# Patient Record
Sex: Male | Born: 1985 | Race: White | Hispanic: No | Marital: Single | Smoking: Never smoker
Health system: Southern US, Community
[De-identification: ages and names within clinical notes are randomized; demographics above are authoritative.]

## PROBLEM LIST (undated history)

## (undated) DIAGNOSIS — K469 Unspecified abdominal hernia without obstruction or gangrene: Secondary | ICD-10-CM

## (undated) DIAGNOSIS — K56609 Unspecified intestinal obstruction, unspecified as to partial versus complete obstruction: Secondary | ICD-10-CM

## (undated) HISTORY — PX: APPENDECTOMY: SHX54

---

## 2018-03-06 DIAGNOSIS — J029 Acute pharyngitis, unspecified: Secondary | ICD-10-CM | POA: Diagnosis not present

## 2018-03-06 DIAGNOSIS — H60392 Other infective otitis externa, left ear: Secondary | ICD-10-CM | POA: Diagnosis not present

## 2018-03-06 DIAGNOSIS — H66002 Acute suppurative otitis media without spontaneous rupture of ear drum, left ear: Secondary | ICD-10-CM | POA: Diagnosis not present

## 2018-04-26 ENCOUNTER — Ambulatory Visit (HOSPITAL_COMMUNITY)
Admission: EM | Admit: 2018-04-26 | Discharge: 2018-04-26 | Disposition: A | Discharge status: Home / Self Care | Payer: BLUE CROSS/BLUE SHIELD | Attending: Family Medicine | Admitting: Family Medicine

## 2018-04-26 ENCOUNTER — Encounter (HOSPITAL_COMMUNITY): Payer: Self-pay | Admitting: Emergency Medicine

## 2018-04-26 DIAGNOSIS — J029 Acute pharyngitis, unspecified: Secondary | ICD-10-CM | POA: Insufficient documentation

## 2018-04-26 DIAGNOSIS — H60501 Unspecified acute noninfective otitis externa, right ear: Secondary | ICD-10-CM | POA: Insufficient documentation

## 2018-04-26 LAB — POCT RAPID STREP A: STREPTOCOCCUS, GROUP A SCREEN (DIRECT): NEGATIVE

## 2018-04-26 MED ORDER — ACETIC ACID 2 % OT SOLN: 5.0000 [drp] | Freq: Three times a day (TID) | OTIC | 0 refills | Status: AC

## 2018-04-26 NOTE — Discharge Instructions (Addendum)
Strep test negative, will send out for culture and we will call you with results You will be treated today for swimmers ear.  Use medication as directed and to completion.   Get plenty of rest and push fluids Continue OTC medications like ibuprofen and/or tylenol as needed  Follow up with PCP or [facility] if symptoms persists Return or go to ER if patient has any new or worsening symptoms

## 2018-04-26 NOTE — ED Triage Notes (Signed)
Pt here for sore throat and pain in ears

## 2018-04-26 NOTE — ED Provider Notes (Signed)
[facility]ARE CENTER   [id number] 04/26/18 Arrival Time: 1644  [id number] THROAT and ear pain  SUBJECTIVE: History from: patient.  Robert Franco is a 32 y.o. male who presents with abrupt onset of sore throat and right ear pain x 2 days.  Denies positive sick exposure or precipitating event.  Does admit to running with ear buds.  Has tried OTC medications with temporary relief.  Denies aggravating factors. Has been diagnosed with strep throat and swimmer's ear in the past. Reports similar symptoms. Denies fever, chills, fatigue, sinus pain, rhinorrhea, cough, SOB, wheezing, chest pain, nausea, rash, changes in bowel or bladder habits.    ROS: As per HPI.  History reviewed. No pertinent past medical history. History reviewed. No pertinent surgical history. No Known Allergies No current facility-administered medications on file prior to encounter.    No current outpatient medications on file prior to encounter.   Social History   Socioeconomic History  . Marital status: Single    Spouse name: Not on file  . Number of children: Not on file  . Years of education: Not on file  . Highest education level: Not on file  Occupational History  . Not on file  Social Needs  . Financial resource strain: Not on file  . Food insecurity:    Worry: Not on file    Inability: Not on file  . Transportation needs:    Medical: Not on file    Non-medical: Not on file  Tobacco Use  . Smoking status: Current Some Day Smoker  . Smokeless tobacco: Never Used  Substance and Sexual Activity  . Alcohol use: Yes  . Drug use: Not Currently  . Sexual activity: Not on file  Lifestyle  . Physical activity:    Days per week: Not on file    Minutes per session: Not on file  . Stress: Not on file  Relationships  . Social connections:    Talks on phone: Not on file    Gets together: Not on file    Attends religious service: Not on file    Active member of club or organization: Not on file    Attends  meetings of clubs or organizations: Not on file    Relationship status: Not on file  . Intimate partner violence:    Fear of current or ex partner: Not on file    Emotionally abused: Not on file    Physically abused: Not on file    Forced sexual activity: Not on file  Other Topics Concern  . Not on file  Social History Narrative  . Not on file   History reviewed. No pertinent family history.  OBJECTIVE:  Vitals:   04/26/18 1725  BP: 133/74  Pulse: 65  Resp: 18  Temp: 98 F (36.7 C)  TempSrc: Oral  SpO2: 100%     General appearance: alert; active; nontoxic appearance HEENT: Ears: LT EAC clear, RT EAC appears mildly swollen without erythema,TMs mildly injected, no tenderness with tragal or auricle manipulation; Eyes: PERRL.  EOM grossly intact.  Sinuses nontender; Nose: no obvious clear rhinorrhea; tonsils nonerythematous, uvula midline  Neck: submandibular LAD RT sided Lungs: CTA bilaterally without adventitious breath sounds Heart: regular rate and rhythm.  Radial pulses 2+ symmetrical bilaterally Skin: warm and dry Psychological: alert and cooperative; normal mood and affect  Labs: Results for orders placed or performed during the hospital encounter of 04/26/18 (from the past 24 hour(s))  POCT rapid strep A [facility])  Status: None   Collection Time: 04/26/18  5:42 PM  Result Value Ref Range   Streptococcus, Group A Screen (Direct) NEGATIVE NEGATIVE     ASSESSMENT & PLAN:  1. Acute otitis externa of right ear, unspecified type   2. Sore throat     Meds ordered this encounter  Medications  . acetic acid 2 % otic solution    Sig: Place 5 drops into the right ear 3 (three) times daily for 10 days.    Dispense:  15 mL    Refill:  0    Order Specific Question:   Supervising Provider    Answer:   Isa Rankin [[id number]]   Strep test negative, will send out for culture and we will call you with results You will be treated today for swimmers ear.   Use medication as directed and to completion.   Get plenty of rest and push fluids Continue OTC medications like ibuprofen and/or tylenol as needed  Follow up with PCP if symptoms persists Return or go to ER if patient has any new or worsening symptoms   Reviewed expectations re: course of current medical issues. Questions answered. Outlined signs and symptoms indicating need for more acute intervention. Patient verbalized understanding. After Visit Summary given.        Rennis Harding, PA-C 04/26/18 1826

## 2018-04-29 LAB — CULTURE, GROUP A STREP (THRC)

## 2018-09-18 DIAGNOSIS — M791 Myalgia, unspecified site: Secondary | ICD-10-CM | POA: Diagnosis not present

## 2018-09-18 DIAGNOSIS — H66003 Acute suppurative otitis media without spontaneous rupture of ear drum, bilateral: Secondary | ICD-10-CM | POA: Diagnosis not present

## 2018-09-18 DIAGNOSIS — J029 Acute pharyngitis, unspecified: Secondary | ICD-10-CM | POA: Diagnosis not present

## 2018-09-18 DIAGNOSIS — J111 Influenza due to unidentified influenza virus with other respiratory manifestations: Secondary | ICD-10-CM | POA: Diagnosis not present

## 2019-01-24 ENCOUNTER — Encounter (HOSPITAL_COMMUNITY): Payer: Self-pay | Admitting: *Deleted

## 2019-01-24 ENCOUNTER — Ambulatory Visit (INDEPENDENT_AMBULATORY_CARE_PROVIDER_SITE_OTHER)
Admission: EM | Admit: 2019-01-24 | Discharge: 2019-01-24 | Disposition: A | Discharge status: Home / Self Care | Payer: BC Managed Care – PPO | Source: Home / Self Care

## 2019-01-24 ENCOUNTER — Emergency Department (HOSPITAL_COMMUNITY): Payer: BC Managed Care – PPO

## 2019-01-24 ENCOUNTER — Other Ambulatory Visit: Payer: Self-pay

## 2019-01-24 ENCOUNTER — Emergency Department (HOSPITAL_COMMUNITY)
Admission: EM | Admit: 2019-01-24 | Discharge: 2019-01-24 | Disposition: A | Discharge status: Home / Self Care | Payer: BC Managed Care – PPO | Attending: Emergency Medicine | Admitting: Emergency Medicine

## 2019-01-24 ENCOUNTER — Encounter (HOSPITAL_COMMUNITY): Payer: Self-pay

## 2019-01-24 DIAGNOSIS — R42 Dizziness and giddiness: Secondary | ICD-10-CM

## 2019-01-24 DIAGNOSIS — R110 Nausea: Secondary | ICD-10-CM | POA: Diagnosis not present

## 2019-01-24 DIAGNOSIS — F1290 Cannabis use, unspecified, uncomplicated: Secondary | ICD-10-CM | POA: Diagnosis not present

## 2019-01-24 DIAGNOSIS — R51 Headache: Secondary | ICD-10-CM | POA: Insufficient documentation

## 2019-01-24 DIAGNOSIS — R519 Headache, unspecified: Secondary | ICD-10-CM

## 2019-01-24 LAB — BASIC METABOLIC PANEL
Anion gap: 12 (ref 5–15)
BUN: 8 mg/dL (ref 6–20)
CO2: 24 mmol/L (ref 22–32)
Calcium: 9.8 mg/dL (ref 8.9–10.3)
Chloride: 103 mmol/L (ref 98–111)
Creatinine, Ser: 0.9 mg/dL (ref 0.61–1.24)
GFR calc Af Amer: >60 mL/min (ref >60)
GFR calc non Af Amer: >60 mL/min (ref >60)
Glucose, Bld: 77 mg/dL (ref 70–99)
Potassium: 4.3 mmol/L (ref 3.5–5.1)
Sodium: 139 mmol/L (ref 135–145)

## 2019-01-24 LAB — CBC
HCT: 46 % (ref 39.0–52.0)
Hemoglobin: 16.4 g/dL (ref 13.0–17.0)
MCH: 33.3 pg (ref 26.0–34.0)
MCHC: 35.7 g/dL (ref 30.0–36.0)
MCV: 93.5 fL (ref 80.0–100.0)
Platelets: 255 10*3/uL (ref 150–400)
RBC: 4.92 MIL/uL (ref 4.22–5.81)
RDW: 11.8 % (ref 11.5–15.5)
WBC: 5.3 10*3/uL (ref 4.0–10.5)
nRBC: 0 % (ref 0.0–0.2)

## 2019-01-24 MED ORDER — DIPHENHYDRAMINE HCL 25 MG PO CAPS
50.0000 mg | ORAL_CAPSULE | Freq: Once | ORAL | Status: AC
  Administered 2019-01-24: 50 mg via ORAL
  Filled 2019-01-24: qty 2

## 2019-01-24 MED ORDER — SODIUM CHLORIDE 0.9% FLUSH
3.0000 mL | Freq: Once | INTRAVENOUS | Status: AC
  Administered 2019-01-24: 3 mL via INTRAVENOUS

## 2019-01-24 MED ORDER — KETOROLAC TROMETHAMINE 30 MG/ML IJ SOLN
30.0000 mg | Freq: Once | INTRAMUSCULAR | Status: AC
  Administered 2019-01-24: 30 mg via INTRAVENOUS

## 2019-01-24 MED ORDER — SODIUM CHLORIDE 0.9 % IV BOLUS
1000.0000 mL | Freq: Once | INTRAVENOUS | Status: AC
  Administered 2019-01-24: 1000 mL via INTRAVENOUS

## 2019-01-24 MED ORDER — KETOROLAC TROMETHAMINE 30 MG/ML IJ SOLN
30.0000 mg | Freq: Once | INTRAMUSCULAR | Status: DC
  Filled 2019-01-24: qty 1

## 2019-01-24 MED ORDER — PROCHLORPERAZINE MALEATE 5 MG PO TABS
10.0000 mg | ORAL_TABLET | Freq: Once | ORAL | Status: AC
  Administered 2019-01-24: 10 mg via ORAL
  Filled 2019-01-24: qty 2

## 2019-01-24 NOTE — ED Triage Notes (Signed)
To ED for eval of HA and dizziness for the past 4 weeks. Pt states the pain never goes away but will 'level out'. No seizure. No vomiting. Does complain of nausea. States on father's day he had a syncopal episode - ems came but vs where fine so he wasn't transported. Appears in nad. Pupils equal. No OTC meds used for pain. No neuro deficits noted.

## 2019-01-24 NOTE — ED Provider Notes (Signed)
[facility] EMERGENCY DEPARTMENT Provider Note   CSN: [id number] Arrival date & time: 01/24/19  1419    History   Chief Complaint Chief Complaint  Patient presents with  . Headache  . Dizziness    HPI Robert Franco is a 33 y.o. male who presents to the ED today complaining of gradual onset, consistent, diffuse headache and lightheadedness x 1.5 months. Pt reports that the headaches are present when he wakes up and mildly subsides throughout the day although still present; they then wake him up at nighttime. Pt has not been taking anything for it. He reports that about 1 month ago on father's day he had a syncopal episode; EMS came to evaluated patient but he did not come to the ED afterwards. No other episodes of syncope noted. He was seen at Urgent Care this AM for same; sent to the ED for further workup given length of symptoms. Per patient, UC recommended "scans." Denies fever, chills, vision changes, chest pain, shortness of breath, abdominal pain, nausea, vomiting, diarrhea, room spinning dizziness, unilateral weakness or numbness, neck stiffness, rash, or any other associated symptoms.       History reviewed. No pertinent past medical history.  There are no active problems to display for this patient.   Past Surgical History:  Procedure Laterality Date  . APPENDECTOMY          Home Medications    Prior to Admission medications   Not on File    Family History Family History  Problem Relation Age of Onset  . Healthy Mother   . Hypertension Father     Social History Social History   Tobacco Use  . Smoking status: Never Smoker  . Smokeless tobacco: Never Used  Substance Use Topics  . Alcohol use: Yes    Comment: socially  . Drug use: Yes    Types: Marijuana    Comment: sparingly     Allergies   Patient has no known allergies.   Review of Systems Review of Systems  Constitutional: Negative for chills and fever.  HENT: Negative  for congestion.   Eyes: Negative for visual disturbance.  Respiratory: Negative for cough and shortness of breath.   Cardiovascular: Negative for chest pain.  Gastrointestinal: Negative for abdominal pain, nausea and vomiting.  Genitourinary: Negative for difficulty urinating.  Musculoskeletal: Negative for myalgias, neck pain and neck stiffness.  Skin: Negative for rash.  Neurological: Positive for light-headedness and headaches. Negative for dizziness, weakness and numbness.     Physical Exam Updated Vital Signs BP 133/84 (BP Location: Left Arm)   Pulse 64   Temp 99 F (37.2 C) (Oral)   Resp 19   SpO2 100%   Physical Exam Vitals signs and nursing note reviewed.  Constitutional:      Appearance: He is not ill-appearing.  HENT:     Head: Normocephalic and atraumatic.  Eyes:     Extraocular Movements: Extraocular movements intact.     Right eye: No nystagmus.     Left eye: No nystagmus.     Conjunctiva/sclera: Conjunctivae normal.     Pupils: Pupils are equal, round, and reactive to light.  Neck:     Musculoskeletal: Neck supple.  Cardiovascular:     Rate and Rhythm: Normal rate and regular rhythm.     Heart sounds: Normal heart sounds.  Pulmonary:     Effort: Pulmonary effort is normal.     Breath sounds: Normal breath sounds. No wheezing, rhonchi or rales.  Abdominal:  Palpations: Abdomen is soft.     Tenderness: There is no abdominal tenderness.  Skin:    General: Skin is warm and dry.  Neurological:     Mental Status: He is alert.     Comments: CN 3-12 grossly intact A&O x4 GCS 15 Sensation and strength intact Coordination with finger-to-nose WNL Neg romberg, neg pronator drift       ED Treatments / Results  Labs (all labs ordered are listed, but only abnormal results are displayed) Labs Reviewed  BASIC METABOLIC PANEL  CBC    EKG None  Radiology Ct Head Wo Contrast  Result Date: 01/24/2019 CLINICAL DATA:  Headache and dizziness for 4  weeks. EXAM: CT HEAD WITHOUT CONTRAST TECHNIQUE: Contiguous axial images were obtained from the base of the skull through the vertex without intravenous contrast. COMPARISON:  None. FINDINGS: Brain: No evidence of acute infarction, hemorrhage, hydrocephalus, extra-axial collection or mass lesion/mass effect. Vascular: No hyperdense vessel or unexpected calcification. Skull: Normal. Negative for fracture or focal lesion. Sinuses/Orbits: Negative. Other: None. IMPRESSION: Normal head CT. Electronically Signed   By: Drusilla Kannerhomas  Dalessio M.D.   On: 01/24/2019 18:20    Procedures Procedures (including critical care time)  Medications Ordered in ED Medications  sodium chloride flush (NS) 0.9 % injection 3 mL (3 mLs Intravenous Given 01/24/19 1907)  prochlorperazine (COMPAZINE) tablet 10 mg (10 mg Oral Given 01/24/19 1908)  diphenhydrAMINE (BENADRYL) capsule 50 mg (50 mg Oral Given 01/24/19 1909)  sodium chloride 0.9 % bolus 1,000 mL (1,000 mLs Intravenous New Bag/Given 01/24/19 1907)  ketorolac (TORADOL) 30 MG/ML injection 30 mg (30 mg Intravenous Given 01/24/19 1907)     Initial Impression / Assessment and Plan / ED Course  I have reviewed the triage vital signs and the nursing notes.  Pertinent labs & imaging results that were available during my care of the patient were reviewed by me and considered in my medical decision making (see chart for details).    33 year old male presenting to the ED with consistent headache x 1.5 months with lightheadedness, 1 episode of syncope 1 month ago. [facility] ED for further eval/"scans" of head. Pt reports headaches are present the entire day; he has not taken anything for symptoms. No focal neuro deficits on exam. No nystagmus to suggest vertigo. No meningeal signs. Pt afebrile in the ED as well without tachypnea or tachycardia. Baseline bloodwork obtained while in the waiting room; reassuring today without signs of infection, anemia, or electrolyte  derangements. CT Head ordered due to length of symptoms although do not suggest any abnormalities. Will reevaluate once imaging returns.   CT Head negative at this time. Will proceed with headache cocktail and reevaluate.   Pt reports his headache feels improved after IVF, compazine, toradol, and benadryl. Will discharge home at this time. Resources given for close follow up; pt does not have a PCP currently. Strict return precautions discussed with patient. He is in agreement with plan at this time and stable for discharge home.        Final Clinical Impressions(s) / ED Diagnoses   Final diagnoses:  Nonintractable episodic headache, unspecified headache type    ED Discharge Orders    None       Tanda RockersVenter, Nieko Clarin, PA-C 01/24/19 2156    Mancel BaleWentz, Elliott, MD 01/27/19 [phone]

## 2019-01-24 NOTE — Discharge Instructions (Signed)
I recommend further evaluation of this persistent headache and instability, in the ER.

## 2019-01-24 NOTE — ED Triage Notes (Signed)
Patient presents to Urgent Care with complaints of headaches and dizziness since about a month ago. Patient reports he drinks approx 8 glasses of water per day.

## 2019-01-24 NOTE — ED Notes (Signed)
Pt discharged with all belongings. Discharge instructions reviewed with pt, and pt verbalized understanding. Opportunity for questions provided.  

## 2019-01-24 NOTE — Discharge Instructions (Addendum)
You were seen in the ED today for a headache and lightheadedness; your labwork and CT scan of your head was reassuring today. You were treated for a headache while in the ED which improved your symptoms. Please follow up with someone outpatient; you can call [facility] to see if they are accepting patients. There is also a number on your discharge paperwork that you can call to find a PCP in your area.

## 2019-01-24 NOTE — ED Notes (Signed)
Patient is being discharged from the Urgent [facility] and sent to the Emergency Department via wheelchair by staff. Per Augusto Gamble, patient is stable but in need of higher level of care due to Headache and Dizziness . Patient is aware and verbalizes understanding of plan of care.   Vitals:   01/24/19 1328  BP: (!) 138/94  Pulse: 68  Resp: 16  Temp: 98 F (36.7 C)  SpO2: 100%

## 2019-01-24 NOTE — ED Provider Notes (Signed)
[facility]    CSN: [id number] Arrival date & time: 01/24/19  1311      History   Chief Complaint Chief Complaint  Patient presents with  . Appointment    1:30  . Headache  . Dizziness    HPI Macauley Mossberg is a 33 y.o. male.   Leroy Kennedy presents with complaints of headache, nausea and instability, which started approximately 1 month ago. He feels like it is worsening. Headache is worse when he first wakes in the morning, seems to improve throughout his day. Has had instances while walking that he needs to sit or lean against something for support due to sensation of almost falling. Pain currently is 4/10. No specific known trigger. Nausea, no vomiting. No neck pain. No fevers or URI symptoms. Father's day weekend while out to eat he lost consciousness. EMS arrived and vitals were stable, therefore he did not seek transport. States he has had increased anxiety lately which is now worse due to his symptoms and "snowballing."  History of migraines with aura, but states this has felt different than his typical migraines. He feels he may have lost weight over the past month. He drinks alcohol socially and smokes marijuana every other week or so. Hasn't taken any medications for his symptoms. No chest pain or shortness of breath. Without contributing medical history.      ROS per HPI, negative if not otherwise mentioned.      History reviewed. No pertinent past medical history.  There are no active problems to display for this patient.   History reviewed. No pertinent surgical history.     Home Medications    Prior to Admission medications   Not on File    Family History Family History  Problem Relation Age of Onset  . Healthy Mother   . Hypertension Father     Social History Social History   Tobacco Use  . Smoking status: Never Smoker  . Smokeless tobacco: Never Used  Substance Use Topics  . Alcohol use: Yes    Comment: socially  . Drug use:  Yes    Types: Marijuana    Comment: sparingly     Allergies   Patient has no known allergies.   Review of Systems Review of Systems   Physical Exam Triage Vital Signs ED Triage Vitals  Enc Vitals Group     BP 01/24/19 1328 (!) 138/94     Pulse Rate 01/24/19 1328 68     Resp 01/24/19 1328 16     Temp 01/24/19 1328 98 F (36.7 C)     Temp Source 01/24/19 1328 Oral     SpO2 01/24/19 1328 100 %     Weight --      Height --      Head Circumference --      Peak Flow --      Pain Score 01/24/19 1326 5     Pain Loc --      Pain Edu? --      Excl. in [place]? --    Orthostatic VS for the past 24 hrs:  BP- Lying Pulse- Lying BP- Sitting Pulse- Sitting BP- Standing at 0 minutes Pulse- Standing at 0 minutes  01/24/19 1329 (!) 138/94 68 (!) 149/94 66 (!) 155/97 73    Updated Vital Signs BP (!) 138/94 (BP Location: Right Arm)   Pulse 68   Temp 98 F (36.7 C) (Oral)   Resp 16   SpO2 100%    Physical  Exam Constitutional:      Appearance: He is well-developed.  HENT:     Head: Normocephalic and atraumatic.  Eyes:     Extraocular Movements: Extraocular movements intact.     Pupils: Pupils are equal, round, and reactive to light.  Neck:     Musculoskeletal: Normal range of motion. No neck rigidity.  Cardiovascular:     Rate and Rhythm: Normal rate and regular rhythm.  Pulmonary:     Effort: Pulmonary effort is normal.     Breath sounds: Normal breath sounds.  Skin:    General: Skin is warm and dry.  Neurological:     Mental Status: He is alert and oriented to person, place, and time.     GCS: GCS eye subscore is 4. GCS verbal subscore is 5. GCS motor subscore is 6.     Cranial Nerves: No cranial nerve deficit or facial asymmetry.     Sensory: No sensory deficit.     Gait: Gait normal.     Comments: Patient with subjective sensation of instability with romberg; ambulatory without difficulty       UC Treatments / Results  Labs (all labs ordered are listed, but only  abnormal results are displayed) Labs Reviewed - No data to display  EKG   Radiology No results found.  Procedures Procedures (including critical care time)  Medications Ordered in UC Medications - No data to display  Initial Impression / Assessment and Plan / UC Course  I have reviewed the triage vital signs and the nursing notes.  Pertinent labs & imaging results that were available during my care of the patient were reviewed by me and considered in my medical decision making (see chart for details).     Normal neurological exam. No head injury. 1 month of headache and instability, worse when he wakes. Hx of migraine- migraine variant? However, this feels different than previous migraines, and he had an episode in which he lost consciousness. IIP? Recommend more thorough evaluation in the ER at this time. Nursing staff providing assistance to ER now. Patient verbalized understanding and agreeable to plan.    Final Clinical Impressions(s) / UC Diagnoses   Final diagnoses:  Bad headache  Dizziness  Nausea     Discharge Instructions     I recommend further evaluation of this persistent headache and instability, in the ER.    ED Prescriptions    None     Controlled Substance Prescriptions [place] Controlled Substance Registry consulted? Not Applicable   Georgetta HaberBurky, Camila Maita B, NP 01/24/19 1407

## 2019-04-16 ENCOUNTER — Other Ambulatory Visit: Payer: Self-pay

## 2019-04-16 DIAGNOSIS — Z20822 Contact with and (suspected) exposure to covid-19: Secondary | ICD-10-CM

## 2019-04-17 LAB — NOVEL CORONAVIRUS, NAA: SARS-CoV-2, NAA: DETECTED — AB

## 2019-04-28 ENCOUNTER — Other Ambulatory Visit: Payer: Self-pay

## 2019-04-28 DIAGNOSIS — Z20822 Contact with and (suspected) exposure to covid-19: Secondary | ICD-10-CM

## 2019-04-28 DIAGNOSIS — Z20828 Contact with and (suspected) exposure to other viral communicable diseases: Secondary | ICD-10-CM | POA: Diagnosis not present

## 2019-04-29 LAB — NOVEL CORONAVIRUS, NAA: SARS-CoV-2, NAA: NOT DETECTED

## 2019-08-05 ENCOUNTER — Ambulatory Visit: Payer: BC Managed Care – PPO | Attending: Internal Medicine

## 2019-08-05 DIAGNOSIS — Z20822 Contact with and (suspected) exposure to covid-19: Secondary | ICD-10-CM

## 2019-08-06 LAB — NOVEL CORONAVIRUS, NAA: SARS-CoV-2, NAA: NOT DETECTED

## 2019-09-01 ENCOUNTER — Ambulatory Visit: Payer: BC Managed Care – PPO | Attending: Internal Medicine

## 2019-09-01 DIAGNOSIS — Z20822 Contact with and (suspected) exposure to covid-19: Secondary | ICD-10-CM | POA: Diagnosis not present

## 2019-09-02 LAB — NOVEL CORONAVIRUS, NAA: SARS-CoV-2, NAA: NOT DETECTED

## 2019-12-24 DIAGNOSIS — K4090 Unilateral inguinal hernia, without obstruction or gangrene, not specified as recurrent: Secondary | ICD-10-CM | POA: Diagnosis not present

## 2019-12-24 DIAGNOSIS — R1084 Generalized abdominal pain: Secondary | ICD-10-CM | POA: Diagnosis not present

## 2019-12-24 DIAGNOSIS — Z13220 Encounter for screening for lipoid disorders: Secondary | ICD-10-CM | POA: Diagnosis not present

## 2019-12-24 DIAGNOSIS — Z800 Family history of malignant neoplasm of digestive organs: Secondary | ICD-10-CM | POA: Diagnosis not present

## 2019-12-24 DIAGNOSIS — Z23 Encounter for immunization: Secondary | ICD-10-CM | POA: Diagnosis not present

## 2019-12-24 DIAGNOSIS — Z8719 Personal history of other diseases of the digestive system: Secondary | ICD-10-CM | POA: Diagnosis not present

## 2020-01-08 DIAGNOSIS — Z8719 Personal history of other diseases of the digestive system: Secondary | ICD-10-CM | POA: Diagnosis not present

## 2020-01-08 DIAGNOSIS — K4090 Unilateral inguinal hernia, without obstruction or gangrene, not specified as recurrent: Secondary | ICD-10-CM | POA: Diagnosis not present

## 2020-01-12 ENCOUNTER — Other Ambulatory Visit: Payer: Self-pay | Admitting: Surgery

## 2020-01-12 DIAGNOSIS — Z8719 Personal history of other diseases of the digestive system: Secondary | ICD-10-CM

## 2020-01-13 IMAGING — CT CT HEAD WITHOUT CONTRAST
3 of 4 series · 13 of 47 positions shown, 15 images · non-contrast
Comparison: None.

CLINICAL DATA: Headache and dizziness for 4 weeks.

EXAM:
CT HEAD WITHOUT CONTRAST
TECHNIQUE: Contiguous axial images were obtained from the base of the skull
through the vertex without intravenous contrast.

[Series 3: head wo · axial · 0.46mm/px · z∈[-113,+7]mm · 7 of 34 slices shown, 9 images]
[im 5/34  brain]
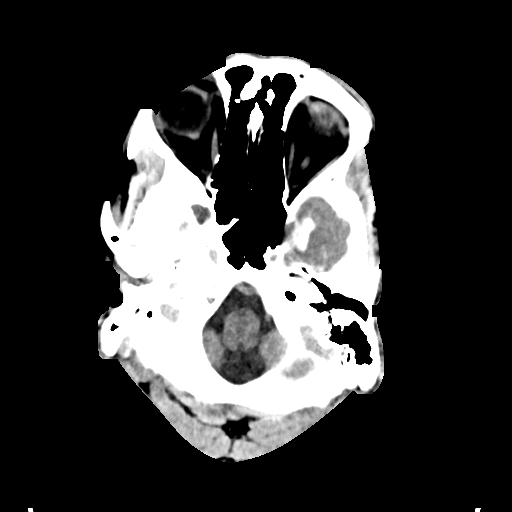
[im 5/34  bone]
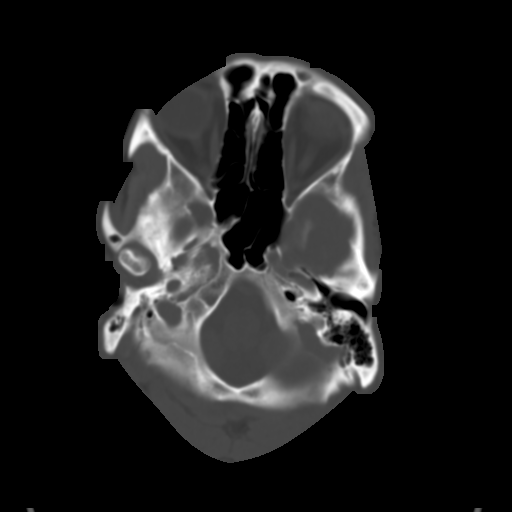
[im 9/34  brain]
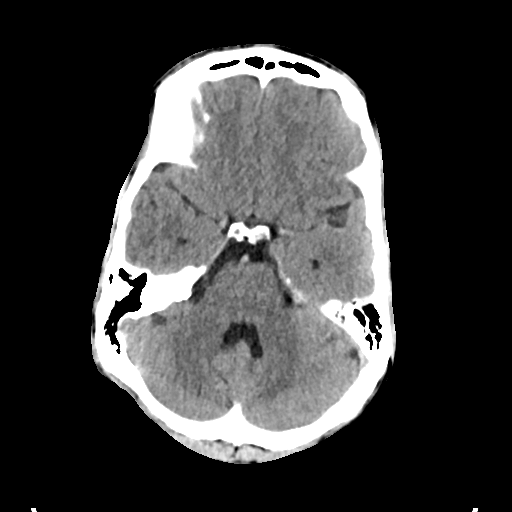
[im 13/34  brain]
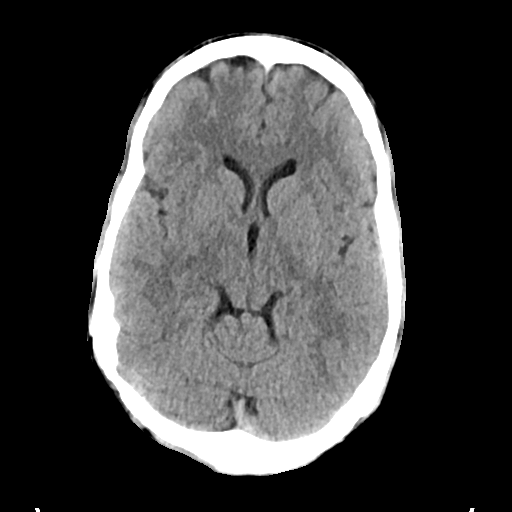
[im 17/34  brain]
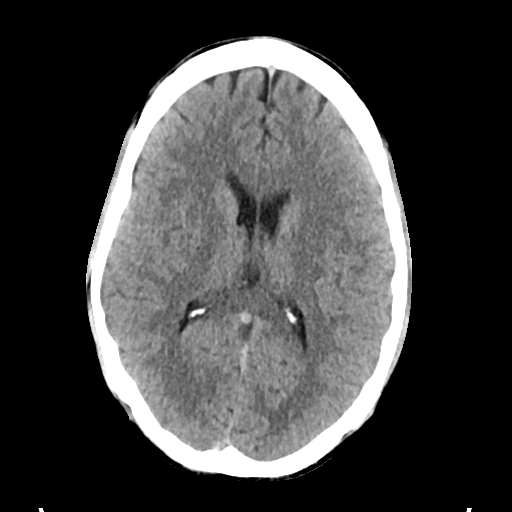
[im 21/34  brain]
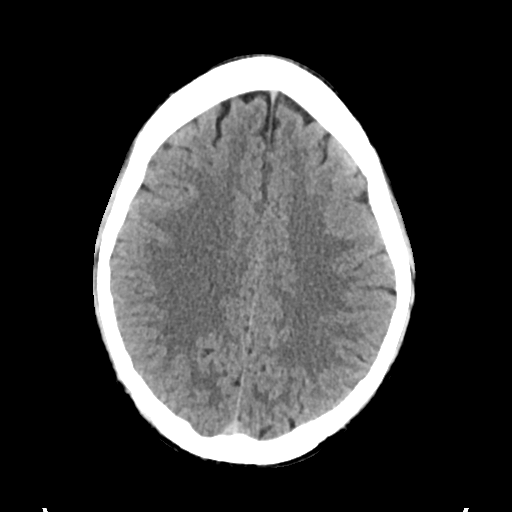
[im 21/34  bone]
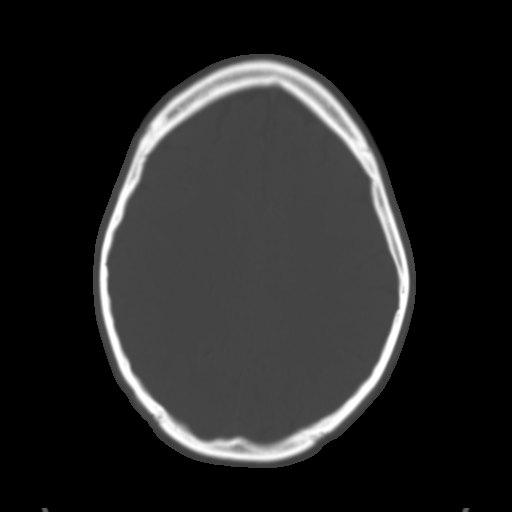
[im 25/34  brain]
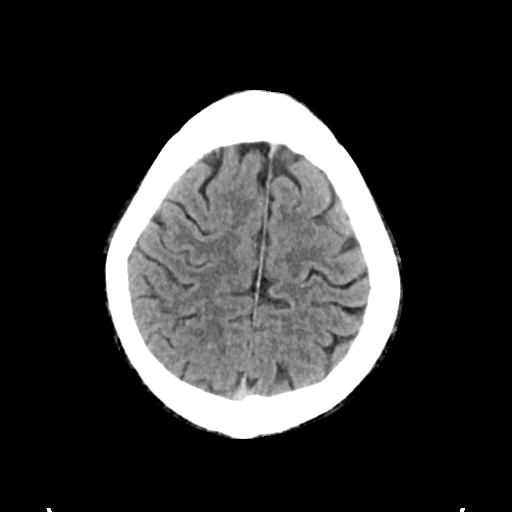
[im 29/34  brain]
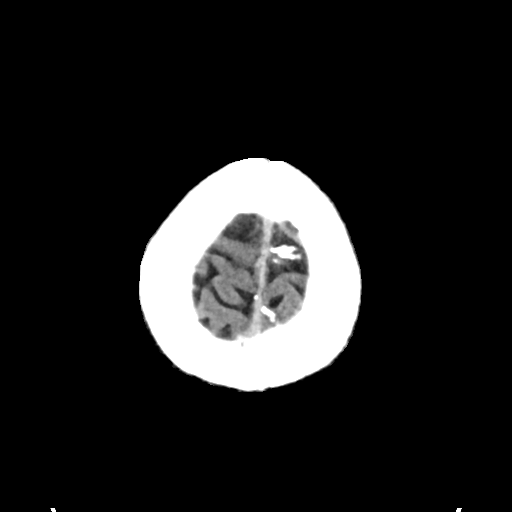

[Series 5: cor soft · coronal · 0.32mm/px · 3 of 77 slices shown]
[im 26/77  brain]
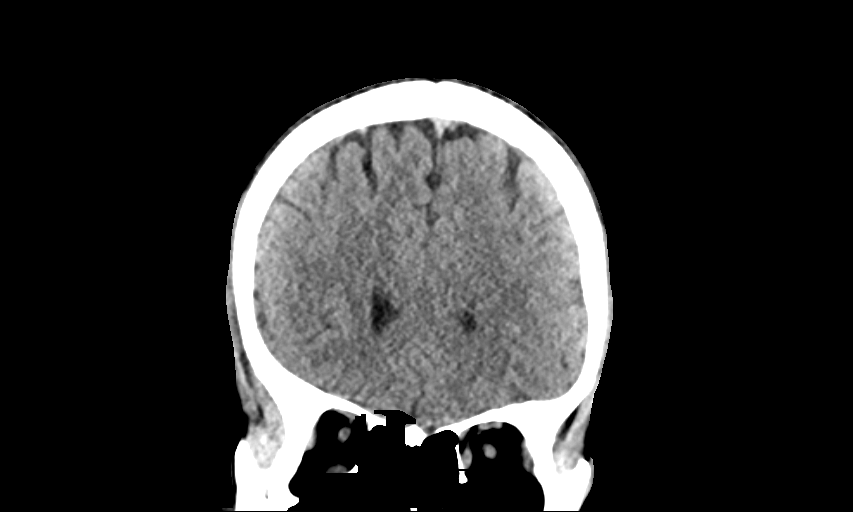
[im 34/77  brain]
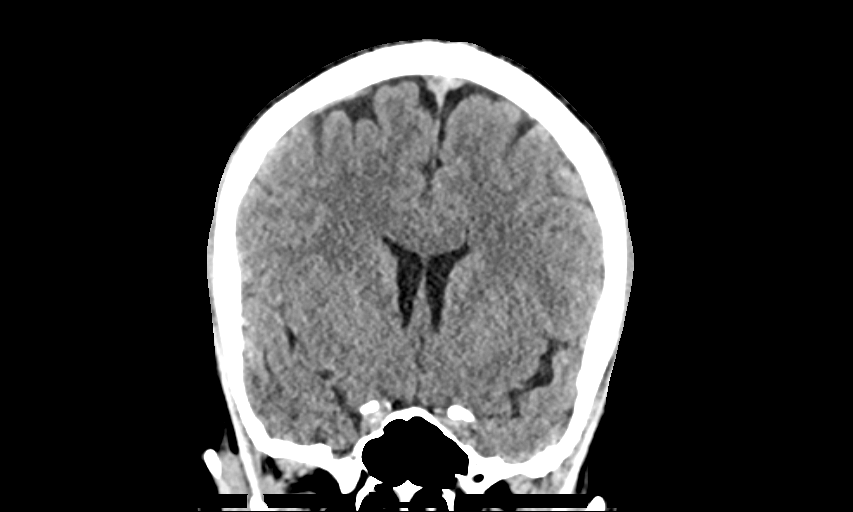
[im 43/77  brain]
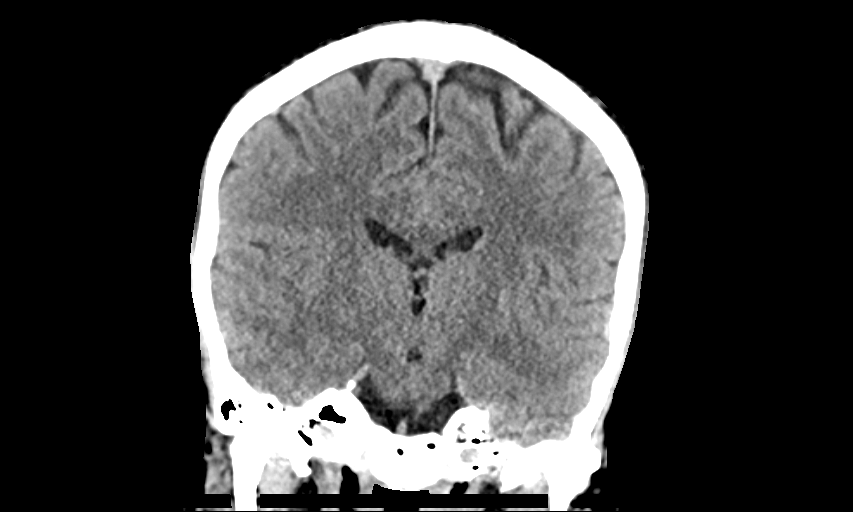

[Series 6: sag soft · sagittal · 0.32mm/px · 3 of 67 slices shown]
[im 23/67  brain]
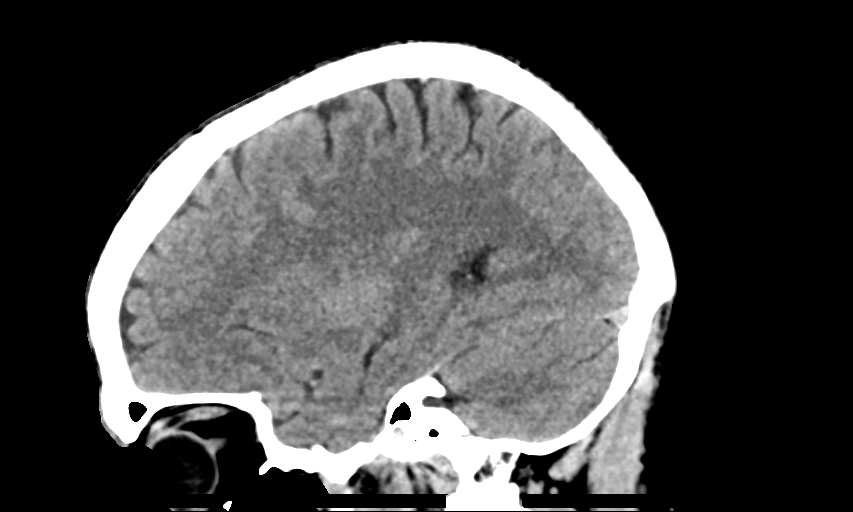
[im 34/67  brain]
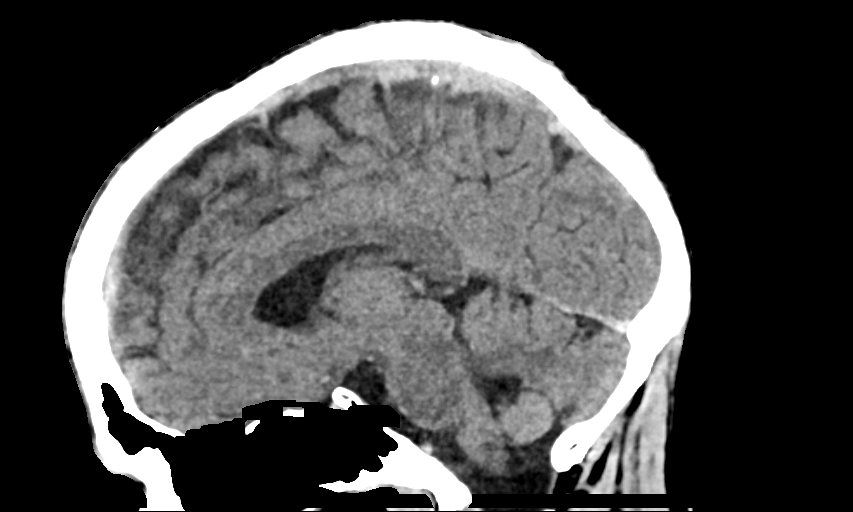
[im 45/67  brain]
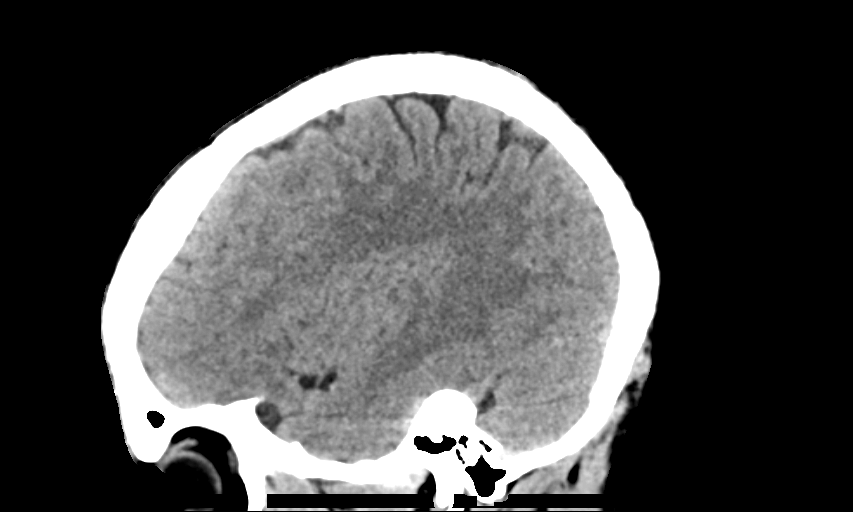

[13 of 47 positions shown; findings below may reference images not displayed]

FINDINGS: Brain: No evidence of acute infarction, hemorrhage, hydrocephalus,
extra-axial collection or mass lesion/mass effect.

Vascular: No hyperdense vessel or unexpected calcification.

Skull: Normal. Negative for fracture or focal lesion.

Sinuses/Orbits: Negative.

Other: None.
IMPRESSION: Normal head CT.

## 2020-02-03 ENCOUNTER — Other Ambulatory Visit: Payer: Self-pay

## 2020-02-03 ENCOUNTER — Ambulatory Visit
Admission: RE | Admit: 2020-02-03 | Discharge: 2020-02-03 | Disposition: A | Discharge status: Home / Self Care | Payer: BC Managed Care – PPO | Source: Ambulatory Visit | Attending: Surgery | Admitting: Surgery

## 2020-02-03 DIAGNOSIS — K56609 Unspecified intestinal obstruction, unspecified as to partial versus complete obstruction: Secondary | ICD-10-CM | POA: Diagnosis not present

## 2020-02-03 DIAGNOSIS — Z8719 Personal history of other diseases of the digestive system: Secondary | ICD-10-CM

## 2020-02-03 MED ORDER — IOPAMIDOL (ISOVUE-300) INJECTION 61%
100.0000 mL | Freq: Once | INTRAVENOUS | Status: AC | PRN
  Administered 2020-02-03: 100 mL via INTRAVENOUS

## 2020-02-06 DIAGNOSIS — K5901 Slow transit constipation: Secondary | ICD-10-CM | POA: Diagnosis not present

## 2020-02-06 DIAGNOSIS — R634 Abnormal weight loss: Secondary | ICD-10-CM | POA: Diagnosis not present

## 2020-03-01 DIAGNOSIS — G4709 Other insomnia: Secondary | ICD-10-CM | POA: Diagnosis not present

## 2020-05-26 ENCOUNTER — Other Ambulatory Visit: Payer: Self-pay

## 2020-05-26 ENCOUNTER — Ambulatory Visit (HOSPITAL_COMMUNITY)
Admission: EM | Admit: 2020-05-26 | Discharge: 2020-05-26 | Disposition: A | Discharge status: Home / Self Care | Payer: BC Managed Care – PPO | Attending: Family Medicine | Admitting: Family Medicine

## 2020-05-26 ENCOUNTER — Encounter (HOSPITAL_COMMUNITY): Payer: Self-pay

## 2020-05-26 ENCOUNTER — Emergency Department (HOSPITAL_COMMUNITY)
Admission: EM | Admit: 2020-05-26 | Discharge: 2020-05-26 | Disposition: A | Discharge status: Home / Self Care | Payer: BC Managed Care – PPO | Attending: Emergency Medicine | Admitting: Emergency Medicine

## 2020-05-26 DIAGNOSIS — Z5321 Procedure and treatment not carried out due to patient leaving prior to being seen by health care provider: Secondary | ICD-10-CM | POA: Diagnosis not present

## 2020-05-26 DIAGNOSIS — R109 Unspecified abdominal pain: Secondary | ICD-10-CM | POA: Insufficient documentation

## 2020-05-26 DIAGNOSIS — R110 Nausea: Secondary | ICD-10-CM | POA: Diagnosis not present

## 2020-05-26 DIAGNOSIS — R197 Diarrhea, unspecified: Secondary | ICD-10-CM | POA: Diagnosis not present

## 2020-05-26 DIAGNOSIS — R1084 Generalized abdominal pain: Secondary | ICD-10-CM

## 2020-05-26 HISTORY — DX: Unspecified intestinal obstruction, unspecified as to partial versus complete obstruction: K56.609

## 2020-05-26 HISTORY — DX: Unspecified abdominal hernia without obstruction or gangrene: K46.9

## 2020-05-26 LAB — URINALYSIS, ROUTINE W REFLEX MICROSCOPIC
Bilirubin Urine: NEGATIVE
Glucose, UA: NEGATIVE mg/dL
Hgb urine dipstick: NEGATIVE
Ketones, ur: NEGATIVE mg/dL
Leukocytes,Ua: NEGATIVE
Nitrite: NEGATIVE
Protein, ur: NEGATIVE mg/dL
Specific Gravity, Urine: 1.003 — ABNORMAL LOW (ref 1.005–1.030)
pH: 6 (ref 5.0–8.0)

## 2020-05-26 LAB — COMPREHENSIVE METABOLIC PANEL
ALT: 49 U/L — ABNORMAL HIGH (ref 0–44)
AST: 37 U/L (ref 15–41)
Albumin: 4.7 g/dL (ref 3.5–5.0)
Alkaline Phosphatase: 31 U/L — ABNORMAL LOW (ref 38–126)
Anion gap: 9 (ref 5–15)
BUN: 8 mg/dL (ref 6–20)
CO2: 26 mmol/L (ref 22–32)
Calcium: 9.6 mg/dL (ref 8.9–10.3)
Chloride: 102 mmol/L (ref 98–111)
Creatinine, Ser: 0.85 mg/dL (ref 0.61–1.24)
GFR, Estimated: >60 mL/min (ref >60)
Glucose, Bld: 83 mg/dL (ref 70–99)
Potassium: 3.8 mmol/L (ref 3.5–5.1)
Sodium: 137 mmol/L (ref 135–145)
Total Bilirubin: 1.1 mg/dL (ref 0.3–1.2)
Total Protein: 7.4 g/dL (ref 6.5–8.1)

## 2020-05-26 LAB — CBC
HCT: 42.6 % (ref 39.0–52.0)
Hemoglobin: 14.5 g/dL (ref 13.0–17.0)
MCH: 32.1 pg (ref 26.0–34.0)
MCHC: 34 g/dL (ref 30.0–36.0)
MCV: 94.2 fL (ref 80.0–100.0)
Platelets: 240 10*3/uL (ref 150–400)
RBC: 4.52 MIL/uL (ref 4.22–5.81)
RDW: 11.9 % (ref 11.5–15.5)
WBC: 5.6 10*3/uL (ref 4.0–10.5)
nRBC: 0 % (ref 0.0–0.2)

## 2020-05-26 LAB — LIPASE, BLOOD: Lipase: 29 U/L (ref 11–51)

## 2020-05-26 NOTE — Discharge Instructions (Signed)

## 2020-05-26 NOTE — ED Notes (Signed)
The pt handed the stickers to the security guard  And told him that he was leaving

## 2020-05-26 NOTE — ED Triage Notes (Signed)
Pt states that he has been having abd pain since Monday, with nausea and diarrhea, hx of bowel obstruction, sent from [facility]

## 2020-05-26 NOTE — ED Triage Notes (Signed)
Pt present abdominal pain with diarrhea. Symptom started last night.  Pt state that he has loss appetite and some nausea.

## 2020-05-27 NOTE — ED Provider Notes (Signed)
[facility]   [id number] 05/26/20 Arrival Time: 1840  ASSESSMENT & PLAN:  1. Generalized abdominal pain     Discussed lab draw tonight with overnight observation. No frank signs of obstruction on exam. After discussed, he does elect ED evaluation given the pain he is feeling along with h/o bowel obstruction. Discharged to ED; stable.    Discharge Instructions     You have been seen today for abdominal pain. Your evaluation was not suggestive of any emergent condition requiring medical intervention at this time. However, some abdominal problems make take more time to appear. Therefore, it is very important for you to pay attention to any new symptoms or worsening of your current condition.  Please return here or to the Emergency Department immediately should you begin to feel worse in any way or have any of the following symptoms: increasing or different abdominal pain, persistent vomiting, inability to drink fluids, fevers, or shaking chills.       Follow-up Information    [facility] EMERGENCY DEPARTMENT.   Specialty: Emergency Medicine Why: If symptoms worsen in any way. Contact information: [address] [id number] mc [place] [address] [phone]               Reviewed expectations re: course of current medical issues. Questions answered. Outlined signs and symptoms indicating need for more acute intervention. Patient verbalized understanding. After Visit Summary given.   SUBJECTIVE: History from: patient. Robert Franco is a 34 y.o. male who presents with complaint of intermittent lower abdominal pain beginning last evening. H/O similar with SBO. "Kind of feels the same". Decreased appetite. Mild nausea; no emesis. Small stool this morning. Does not recall passing gas from rectum today. Ambulatory without difficulty.  Past Surgical History:  Procedure Laterality Date  . APPENDECTOMY        OBJECTIVE:  Vitals:   05/26/20 1934  BP: 118/79  Pulse: 72  Resp: 18  Temp: 97.8 F (36.6 C)  TempSrc: Oral  SpO2: 100%    General appearance: alert, oriented, no acute distress HEENT: Index; AT; oropharynx moist Lungs: unlabored respirations Abdomen: soft; without distention; mild  and poorly localized tenderness, more over lower abd; bowel sounds present; without masses or organomegaly; without guarding or rebound tenderness Back: without reported CVA tenderness; FROM at waist Extremities: without LE edema; symmetrical; without gross deformities Skin: warm and dry Neurologic: normal gait Psychological: alert and cooperative; normal mood and affect    No Known Allergies                                             Past Medical History:  Diagnosis Date  . Bowel obstruction (HCC)   . Hernia of abdominal cavity     Social History   Socioeconomic History  . Marital status: Single    Spouse name: Not on file  . Number of children: Not on file  . Years of education: Not on file  . Highest education level: Not on file  Occupational History  . Not on file  Tobacco Use  . Smoking status: Never Smoker  . Smokeless tobacco: Never Used  Vaping Use  . Vaping Use: Never used  Substance and Sexual Activity  . Alcohol use: Yes    Comment: socially  . Drug use: Yes    Types: Marijuana    Comment: sparingly  . Sexual  activity: Not on file  Other Topics Concern  . Not on file  Social History Narrative  . Not on file   Social Determinants of Health   Financial Resource Strain:   . Difficulty of Paying Living Expenses: Not on file  Food Insecurity:   . Worried About Programme researcher, broadcasting/film/video in the Last Year: Not on file  . Ran Out of Food in the Last Year: Not on file  Transportation Needs:   . Lack of Transportation (Medical): Not on file  . Lack of Transportation (Non-Medical): Not on file  Physical Activity:   . Days of Exercise per Week: Not on file  . Minutes of  Exercise per Session: Not on file  Stress:   . Feeling of Stress : Not on file  Social Connections:   . Frequency of Communication with Friends and Family: Not on file  . Frequency of Social Gatherings with Friends and Family: Not on file  . Attends Religious Services: Not on file  . Active Member of Clubs or Organizations: Not on file  . Attends Banker Meetings: Not on file  . Marital Status: Not on file  Intimate Partner Violence:   . Fear of Current or Ex-Partner: Not on file  . Emotionally Abused: Not on file  . Physically Abused: Not on file  . Sexually Abused: Not on file    Family History  Problem Relation Age of Onset  . Healthy Mother   . Hypertension Father      Mardella Layman, MD 05/27/20 [phone]

## 2020-08-05 DIAGNOSIS — K644 Residual hemorrhoidal skin tags: Secondary | ICD-10-CM | POA: Diagnosis not present

## 2020-08-05 DIAGNOSIS — Z800 Family history of malignant neoplasm of digestive organs: Secondary | ICD-10-CM | POA: Diagnosis not present

## 2020-08-05 DIAGNOSIS — F419 Anxiety disorder, unspecified: Secondary | ICD-10-CM | POA: Diagnosis not present

## 2020-08-09 DIAGNOSIS — Z800 Family history of malignant neoplasm of digestive organs: Secondary | ICD-10-CM | POA: Diagnosis not present

## 2020-09-08 DIAGNOSIS — F419 Anxiety disorder, unspecified: Secondary | ICD-10-CM | POA: Diagnosis not present

## 2020-10-07 DIAGNOSIS — F419 Anxiety disorder, unspecified: Secondary | ICD-10-CM | POA: Diagnosis not present

## 2021-01-11 DIAGNOSIS — Z0000 Encounter for general adult medical examination without abnormal findings: Secondary | ICD-10-CM | POA: Diagnosis not present

## 2021-01-11 DIAGNOSIS — F419 Anxiety disorder, unspecified: Secondary | ICD-10-CM | POA: Diagnosis not present

## 2021-01-11 DIAGNOSIS — K644 Residual hemorrhoidal skin tags: Secondary | ICD-10-CM | POA: Diagnosis not present

## 2021-01-11 DIAGNOSIS — Z8669 Personal history of other diseases of the nervous system and sense organs: Secondary | ICD-10-CM | POA: Diagnosis not present

## 2021-01-11 DIAGNOSIS — K5900 Constipation, unspecified: Secondary | ICD-10-CM | POA: Diagnosis not present

## 2021-01-22 IMAGING — CT CT ENTEROGRAPHY (ABD-PELV W/ CM)
2 of 5 series · 16 of 46 positions shown, 18 images · IV contrast (iopamidol)
Comparison: None.

CLINICAL DATA: 34-year-old male with history of bowel obstruction
secondary to adhesions.

EXAM:
CT ABDOMEN AND PELVIS WITH CONTRAST (ENTEROGRAPHY)
TECHNIQUE: Multidetector CT of the abdomen and pelvis during bolus
administration of intravenous contrast. Negative oral contrast was
given.
CONTRAST:  100mL A2AT6Z-H00 IOPAMIDOL (A2AT6Z-H00) INJECTION 61%

[Series 4: enterography 2.00 br40 s3 cor · coronal · 0.77mm/px · 3 of 143 slices shown]
[im 48/143  soft-tissue]
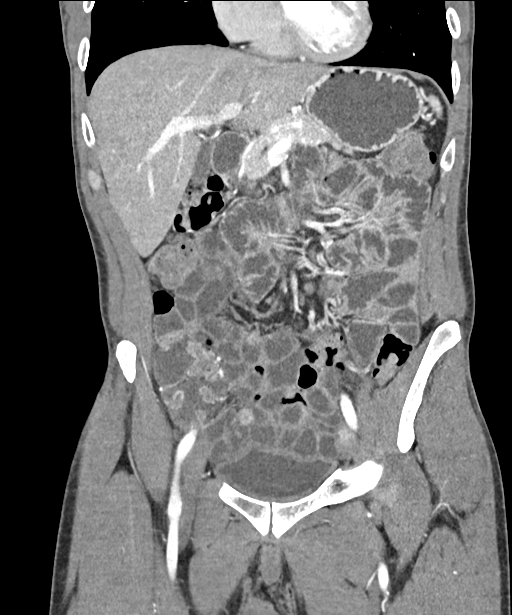
[im 64/143  soft-tissue]
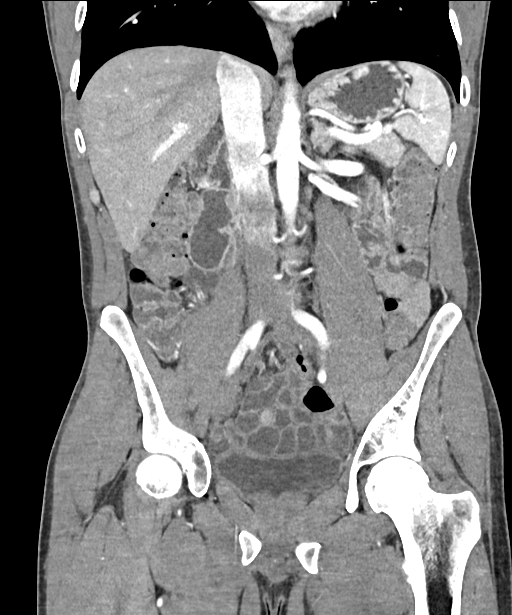
[im 79/143  soft-tissue]
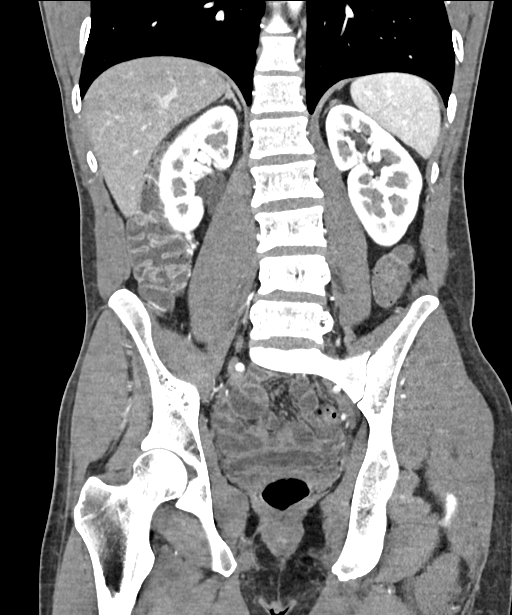

[Series 9: enterography 3.00 br40 s3 axial 3mm · axial · 0.77mm/px · z∈[+1106,+1517]mm · 13 of 157 slices shown, 15 images]
[im 10/157  soft-tissue]
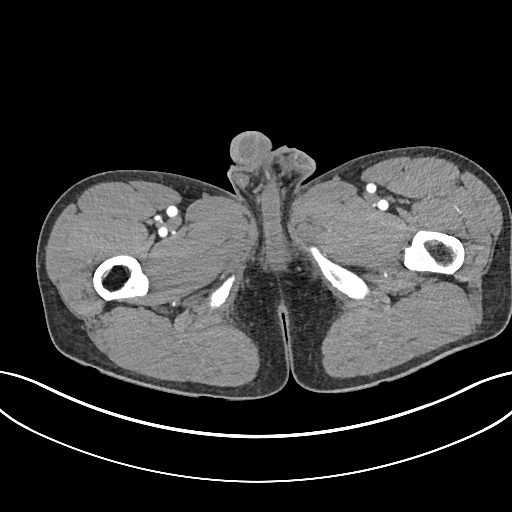
[im 10/157  bone]
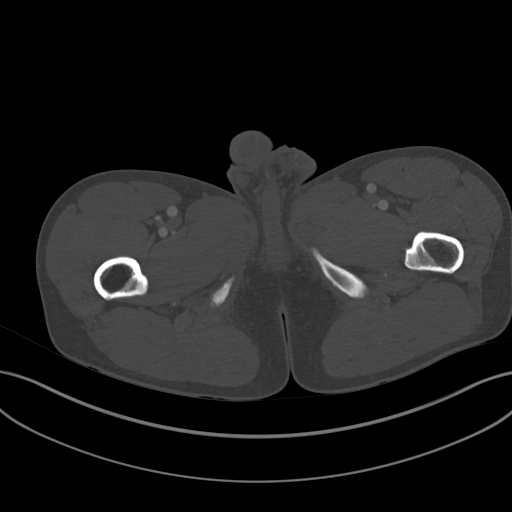
[im 19/157  soft-tissue]
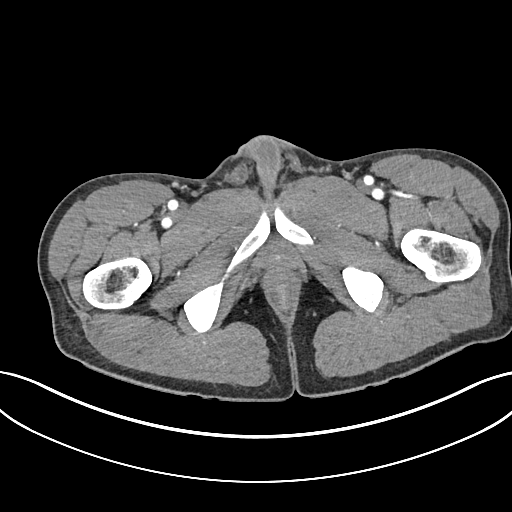
[im 37/157  soft-tissue]
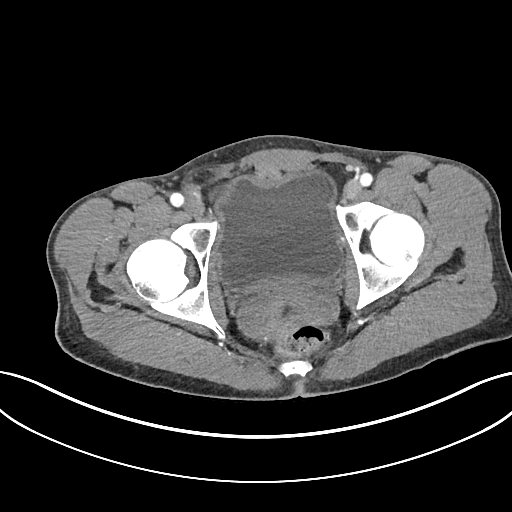
[im 46/157  soft-tissue]
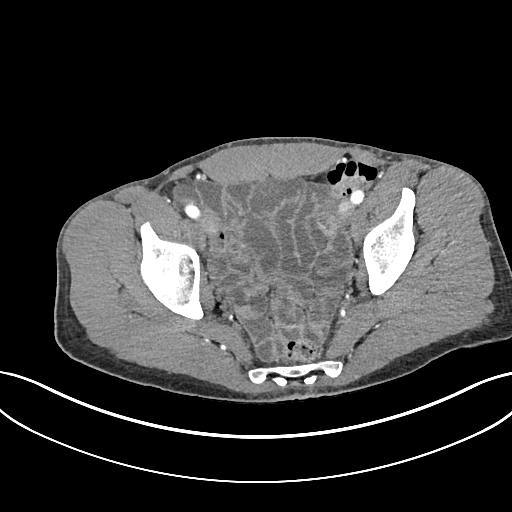
[im 56/157  soft-tissue]
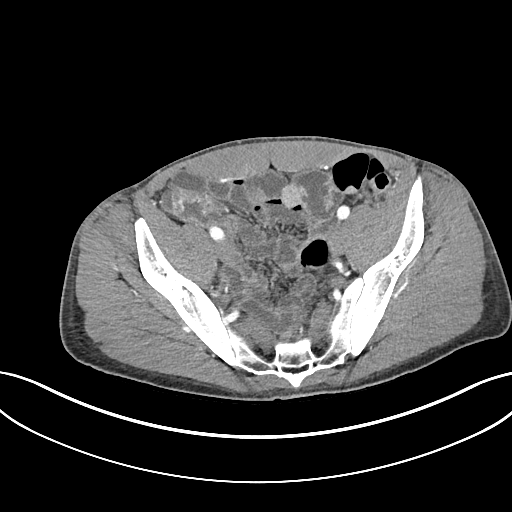
[im 65/157  soft-tissue]
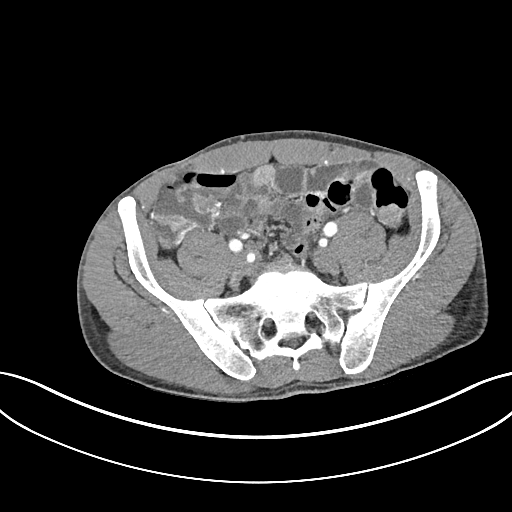
[im 83/157  soft-tissue]
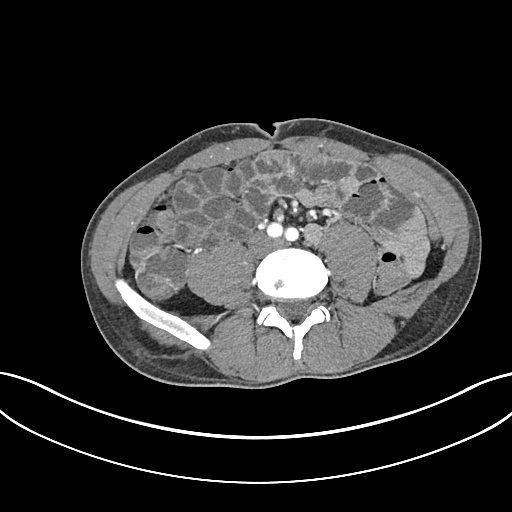
[im 92/157  soft-tissue]
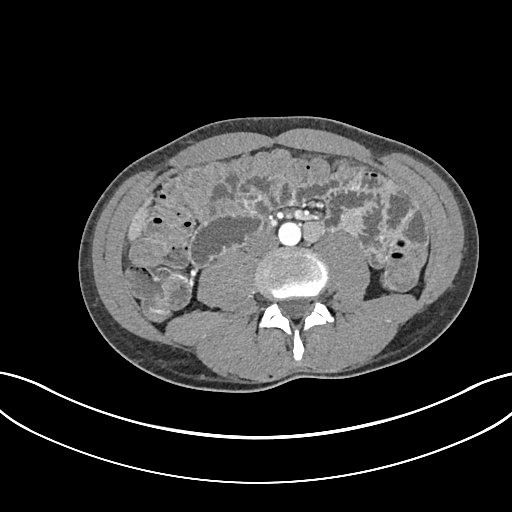
[im 101/157  soft-tissue]
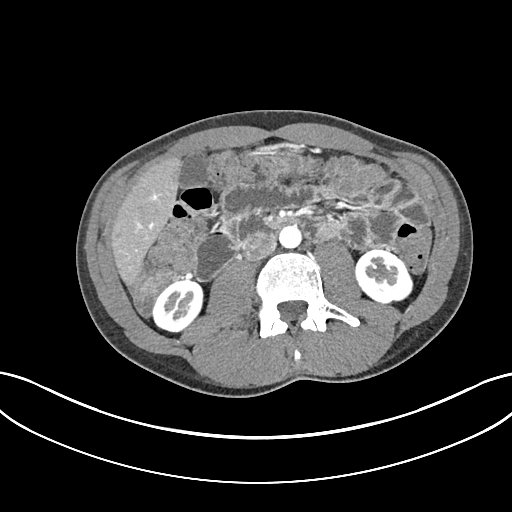
[im 101/157  bone]
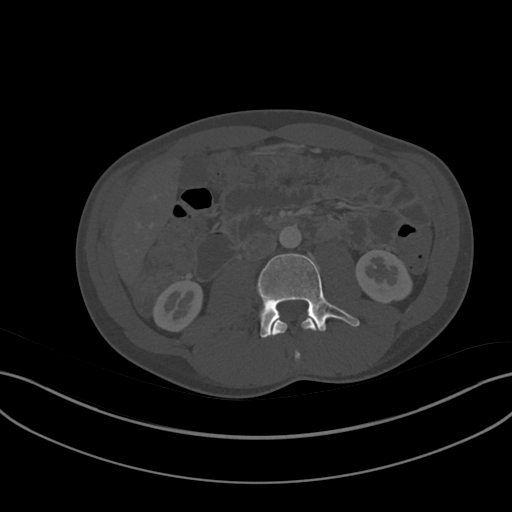
[im 111/157  soft-tissue]
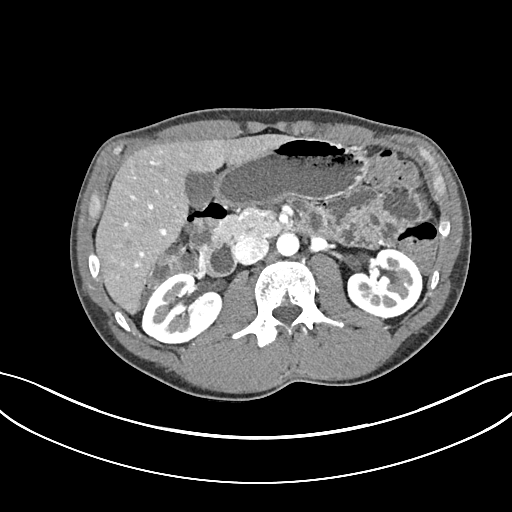
[im 120/157  soft-tissue]
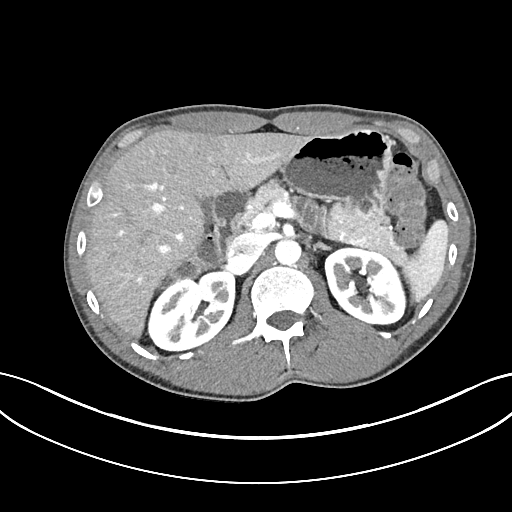
[im 138/157  soft-tissue]
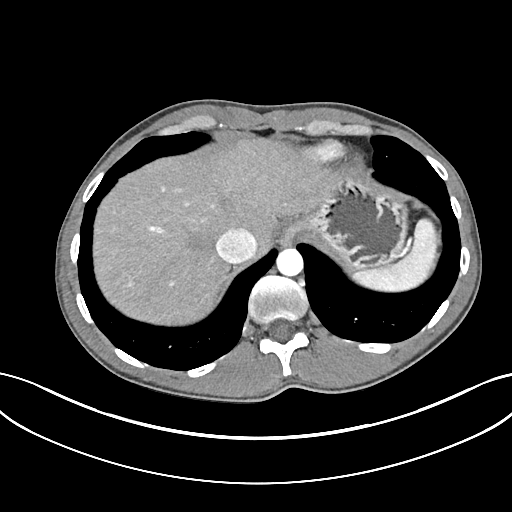
[im 147/157  soft-tissue]
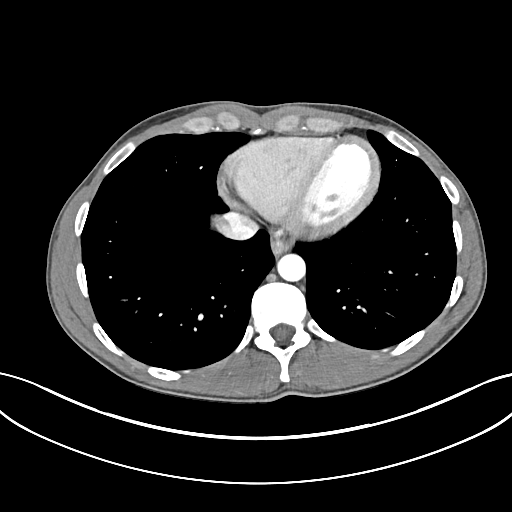

[16 of 46 positions shown; findings below may reference images not displayed]

FINDINGS: Lower chest:  Unremarkable.

Hepatobiliary: No suspicious cystic or solid hepatic lesions. No
intra or extrahepatic biliary ductal dilatation. Gallbladder is
normal in appearance.

Pancreas: No pancreatic mass. No pancreatic ductal dilatation. No
pancreatic or peripancreatic fluid collections or inflammatory
changes.

Spleen: Unremarkable.

Adrenals/Urinary Tract: Bilateral kidneys and adrenal glands are
normal in appearance. No hydroureteronephrosis. Urinary bladder is
normal in appearance.

Stomach/Bowel: Normal appearance of the stomach. No pathologic
dilatation of small bowel or colon. No focal areas of mural
thickening, mucosal hyperenhancement or surrounding inflammatory
changes are noted in the stomach, small bowel or colon. Normal
appendix.

Vascular/Lymphatic: No significant atherosclerotic disease, aneurysm
or dissection noted in the abdominal or pelvic vasculature. No
lymphadenopathy noted in the abdomen or pelvis.

Reproductive: Prostate gland and seminal vesicles are unremarkable
in appearance.

Other: No significant volume of ascites.  No pneumoperitoneum.

Musculoskeletal: There are no aggressive appearing lytic or blastic
lesions noted in the visualized portions of the skeleton.
IMPRESSION: 1. No acute findings are noted in the abdomen or pelvis.
Specifically, no evidence to suggest persistent bowel obstruction.

## 2021-03-28 DIAGNOSIS — M79672 Pain in left foot: Secondary | ICD-10-CM | POA: Diagnosis not present

## 2021-06-11 DIAGNOSIS — R051 Acute cough: Secondary | ICD-10-CM | POA: Diagnosis not present

## 2021-06-11 DIAGNOSIS — J209 Acute bronchitis, unspecified: Secondary | ICD-10-CM | POA: Diagnosis not present

## 2021-06-11 DIAGNOSIS — R0989 Other specified symptoms and signs involving the circulatory and respiratory systems: Secondary | ICD-10-CM | POA: Diagnosis not present

## 2022-01-13 DIAGNOSIS — Z0000 Encounter for general adult medical examination without abnormal findings: Secondary | ICD-10-CM | POA: Diagnosis not present

## 2022-02-03 DIAGNOSIS — Z800 Family history of malignant neoplasm of digestive organs: Secondary | ICD-10-CM | POA: Diagnosis not present

## 2022-04-07 DIAGNOSIS — R0789 Other chest pain: Secondary | ICD-10-CM | POA: Diagnosis not present

## 2022-04-07 DIAGNOSIS — R059 Cough, unspecified: Secondary | ICD-10-CM | POA: Diagnosis not present

## 2022-07-05 DIAGNOSIS — J069 Acute upper respiratory infection, unspecified: Secondary | ICD-10-CM | POA: Diagnosis not present

## 2022-07-05 DIAGNOSIS — J029 Acute pharyngitis, unspecified: Secondary | ICD-10-CM | POA: Diagnosis not present

## 2022-07-05 DIAGNOSIS — R0982 Postnasal drip: Secondary | ICD-10-CM | POA: Diagnosis not present

## 2022-11-15 DIAGNOSIS — Z0000 Encounter for general adult medical examination without abnormal findings: Secondary | ICD-10-CM | POA: Diagnosis not present

## 2023-01-23 ENCOUNTER — Other Ambulatory Visit: Payer: Self-pay | Admitting: Physician Assistant

## 2023-01-23 ENCOUNTER — Emergency Department (HOSPITAL_COMMUNITY)
Admission: EM | Admit: 2023-01-23 | Discharge: 2023-01-23 | Disposition: A | Discharge status: Home / Self Care | Payer: BC Managed Care – PPO | Attending: Emergency Medicine | Admitting: Emergency Medicine

## 2023-01-23 ENCOUNTER — Encounter (HOSPITAL_COMMUNITY): Payer: Self-pay

## 2023-01-23 ENCOUNTER — Emergency Department (HOSPITAL_COMMUNITY): Payer: BC Managed Care – PPO

## 2023-01-23 ENCOUNTER — Other Ambulatory Visit: Payer: Self-pay

## 2023-01-23 DIAGNOSIS — R109 Unspecified abdominal pain: Secondary | ICD-10-CM | POA: Diagnosis not present

## 2023-01-23 DIAGNOSIS — Z9049 Acquired absence of other specified parts of digestive tract: Secondary | ICD-10-CM | POA: Diagnosis not present

## 2023-01-23 DIAGNOSIS — R1084 Generalized abdominal pain: Secondary | ICD-10-CM | POA: Diagnosis not present

## 2023-01-23 LAB — COMPREHENSIVE METABOLIC PANEL
ALT: 22 U/L (ref 0–44)
AST: 24 U/L (ref 15–41)
Albumin: 4.7 g/dL (ref 3.5–5.0)
Alkaline Phosphatase: 32 U/L — ABNORMAL LOW (ref 38–126)
Anion gap: 10 (ref 5–15)
BUN: 9 mg/dL (ref 6–20)
CO2: 24 mmol/L (ref 22–32)
Calcium: 9.2 mg/dL (ref 8.9–10.3)
Chloride: 105 mmol/L (ref 98–111)
Creatinine, Ser: 0.78 mg/dL (ref 0.61–1.24)
GFR, Estimated: >60 mL/min (ref >60)
Glucose, Bld: 84 mg/dL (ref 70–99)
Potassium: 3.8 mmol/L (ref 3.5–5.1)
Sodium: 139 mmol/L (ref 135–145)
Total Bilirubin: 0.9 mg/dL (ref 0.3–1.2)
Total Protein: 7.6 g/dL (ref 6.5–8.1)

## 2023-01-23 LAB — URINALYSIS, ROUTINE W REFLEX MICROSCOPIC
Bilirubin Urine: NEGATIVE
Glucose, UA: NEGATIVE mg/dL
Hgb urine dipstick: NEGATIVE
Ketones, ur: 20 mg/dL — AB
Leukocytes,Ua: NEGATIVE
Nitrite: NEGATIVE
Protein, ur: NEGATIVE mg/dL
Specific Gravity, Urine: 1.009 (ref 1.005–1.030)
pH: 7 (ref 5.0–8.0)

## 2023-01-23 LAB — CBC WITH DIFFERENTIAL/PLATELET
Abs Immature Granulocytes: 0.01 10*3/uL (ref 0.00–0.07)
Basophils Absolute: 0.1 10*3/uL (ref 0.0–0.1)
Basophils Relative: 1 %
Eosinophils Absolute: 0.1 10*3/uL (ref 0.0–0.5)
Eosinophils Relative: 2 %
HCT: 43.8 % (ref 39.0–52.0)
Hemoglobin: 15.2 g/dL (ref 13.0–17.0)
Immature Granulocytes: 0 %
Lymphocytes Relative: 45 %
Lymphs Abs: 2.2 10*3/uL (ref 0.7–4.0)
MCH: 32.3 pg (ref 26.0–34.0)
MCHC: 34.7 g/dL (ref 30.0–36.0)
MCV: 93 fL (ref 80.0–100.0)
Monocytes Absolute: 0.3 10*3/uL (ref 0.1–1.0)
Monocytes Relative: 7 %
Neutro Abs: 2.1 10*3/uL (ref 1.7–7.7)
Neutrophils Relative %: 45 %
Platelets: 230 10*3/uL (ref 150–400)
RBC: 4.71 MIL/uL (ref 4.22–5.81)
RDW: 11.9 % (ref 11.5–15.5)
WBC: 4.8 10*3/uL (ref 4.0–10.5)
nRBC: 0 % (ref 0.0–0.2)

## 2023-01-23 LAB — LACTIC ACID, PLASMA: Lactic Acid, Venous: 1 mmol/L (ref 0.5–1.9)

## 2023-01-23 LAB — LIPASE, BLOOD: Lipase: 31 U/L (ref 11–51)

## 2023-01-23 MED ORDER — SODIUM CHLORIDE 0.9 % IV BOLUS
1000.0000 mL | Freq: Once | INTRAVENOUS | Status: AC
  Administered 2023-01-23: 1000 mL via INTRAVENOUS

## 2023-01-23 MED ORDER — OXYCODONE-ACETAMINOPHEN 5-325 MG PO TABS: 1.0000 | ORAL_TABLET | Freq: Once | ORAL | Status: DC

## 2023-01-23 MED ORDER — MORPHINE SULFATE (PF) 4 MG/ML IV SOLN
4.0000 mg | Freq: Once | INTRAVENOUS | Status: AC
  Administered 2023-01-23: 4 mg via INTRAVENOUS
  Filled 2023-01-23: qty 1

## 2023-01-23 MED ORDER — IOHEXOL 300 MG/ML  SOLN
100.0000 mL | Freq: Once | INTRAMUSCULAR | Status: AC | PRN
  Administered 2023-01-23: 100 mL via INTRAVENOUS

## 2023-01-23 MED ORDER — ONDANSETRON HCL 4 MG/2ML IJ SOLN
4.0000 mg | Freq: Once | INTRAMUSCULAR | Status: AC
  Administered 2023-01-23: 4 mg via INTRAVENOUS
  Filled 2023-01-23: qty 2

## 2023-01-23 MED ORDER — ONDANSETRON 4 MG PO TBDP
4.0000 mg | ORAL_TABLET | Freq: Once | ORAL | Status: AC
  Administered 2023-01-23: 4 mg via ORAL
  Filled 2023-01-23: qty 1

## 2023-01-23 MED ORDER — OXYCODONE-ACETAMINOPHEN 5-325 MG PO TABS
1.0000 | ORAL_TABLET | Freq: Once | ORAL | Status: AC
  Administered 2023-01-23: 1 via ORAL
  Filled 2023-01-23: qty 1

## 2023-01-23 MED ORDER — OXYCODONE-ACETAMINOPHEN 5-325 MG PO TABS: 1.0000 | ORAL_TABLET | Freq: Four times a day (QID) | ORAL | 0 refills | Status: AC | PRN

## 2023-01-23 MED ORDER — ONDANSETRON HCL 4 MG PO TABS: 4.0000 mg | ORAL_TABLET | Freq: Four times a day (QID) | ORAL | 0 refills | Status: AC

## 2023-01-23 NOTE — ED Provider Triage Note (Signed)
Emergency Medicine Provider Triage Evaluation Note  Robert Franco , a 37 y.o. male  was evaluated in triage.  Pt complains of abdominal pain for a week. Constant pain with no vomiting. Went to PCP today and sent for further evaluation. Had appendectomy age 79 and then had SBO 2018.   Review of Systems  Positive: Abdominal pain  Negative: vomtiing  Physical Exam  BP (!) 141/85 (BP Location: Left Arm)   Pulse 97   Temp 97.7 F (36.5 C) (Oral)   Resp 15   Ht 5\' 10"  (1.778 m)   Wt 72.6 kg   SpO2 100%   BMI 22.96 kg/m  Gen:   Awake, no distress   Resp:  Normal effort  MSK:   Moves extremities without difficulty  Other:  Mild diffuse abdominal tenderness  Medical Decision Making  Medically screening exam initiated at 5:10 PM.  Appropriate orders placed.  Robert Franco was informed that the remainder of the evaluation will be completed by another provider, this initial triage assessment does not replace that evaluation, and the importance of remaining in the ED until their evaluation is complete.  Robert Franco is a 37 y.o. male hx of appendectomy, SBo here with ab pain. Diffuse ab pain for a week. Will get labs and CT ab/pel    Charlynne Pander, MD 01/23/23 [phone]

## 2023-01-23 NOTE — Discharge Instructions (Signed)
Your laboratories also are within normal limits today.  You were given a copy of your CT scan.  You will go home on a short course of nausea medication along with pain medication.  You will need to schedule an appointment with Dr. Ewing Schlein to help further evaluate your abdominal pain.

## 2023-01-23 NOTE — ED Triage Notes (Signed)
Patient presented to ER for abdominal pain. Patient states pain has been going on for about a week, last BM this morning. 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 Patient said he saw PCP who did blood test for "liver, pancreas, and gall bladder issues" but has not heard anything back about the results. Pt A&Ox4, ambulatory to triage bay.

## 2023-01-23 NOTE — ED Provider Notes (Signed)
[facility] EMERGENCY DEPARTMENT AT [facility] Provider Note   CSN: [id number] Arrival date & time: 01/23/23  1436     History SBO Chief Complaint  Patient presents with   Abdominal Pain    Robert Franco is a 37 y.o. male.  37 y.o male with a PMH of SBO, appendectomy presents to the ED with a chief complaint of generalized abdominal pain that is been ongoing since July 3, this abdominal pain occurs at night and usually wakes him up from his sleep.  He reports the pain is been waxing and waning, generalized in nature without any focal point of tenderness.  He was evaluated by PCP today, sent in to rule out small bowel obstruction versus gallbladder etiology.  He endorses some nausea, along with vomiting.  He has tried taking some over-the-counter medication without any improvement in symptoms.  He does not feel that this is a burning sensation such as reflux.  Denies any fever, denies feeling the same symptoms as he did when he had a bowel obstruction.  Last bowel movement was yesterday without any blood in his stool.  The history is provided by the patient and medical records.  Abdominal Pain Associated symptoms: nausea and vomiting   Associated symptoms: no chest pain, no chills, no fever and no shortness of breath        Home Medications Prior to Admission medications   Medication Sig Start Date End Date Taking? Authorizing Provider  ondansetron (ZOFRAN) 4 MG tablet Take 1 tablet (4 mg total) by mouth every 6 (six) hours for 5 days. 01/23/23 01/28/23 Yes Velencia Lenart, Leonie Douglas, PA-C  oxyCODONE-acetaminophen (PERCOCET/ROXICET) 5-325 MG tablet Take 1 tablet by mouth every 6 (six) hours as needed for up to 3 days for severe pain. 01/23/23 01/26/23 Yes Claude Manges, PA-C      Allergies    Patient has no known allergies.    Review of Systems   Review of Systems  Constitutional:  Negative for chills and fever.  Respiratory:  Negative for shortness of breath.   Cardiovascular:  Negative  for chest pain.  Gastrointestinal:  Positive for abdominal pain, nausea and vomiting.  Genitourinary:  Negative for flank pain.  Musculoskeletal:  Negative for back pain.  Skin:  Negative for pallor and wound.  All other systems reviewed and are negative.   Physical Exam Updated Vital Signs BP 128/82 (BP Location: Right Arm)   Pulse 61   Temp 98.5 F (36.9 C) (Oral)   Resp 18   Ht 5\' 10"  (1.778 m)   Wt 72.6 kg   SpO2 100%   BMI 22.96 kg/m  Physical Exam Vitals and nursing note reviewed.  HENT:     Head: Normocephalic and atraumatic.  Cardiovascular:     Rate and Rhythm: Normal rate.  Pulmonary:     Effort: Pulmonary effort is normal.     Breath sounds: No wheezing.  Abdominal:     General: Abdomen is flat. Bowel sounds are decreased.     Palpations: Abdomen is soft.     Tenderness: There is generalized abdominal tenderness. There is guarding. There is no right CVA tenderness or left CVA tenderness.  Skin:    General: Skin is warm and dry.  Neurological:     Mental Status: He is alert and oriented to person, place, and time.     ED Results / Procedures / Treatments   Labs (all labs ordered are listed, but only abnormal results are displayed) Labs Reviewed  COMPREHENSIVE METABOLIC  PANEL - Abnormal; Notable for the following components:      Result Value   Alkaline Phosphatase 32 (*)    All other components within normal limits  URINALYSIS, ROUTINE W REFLEX MICROSCOPIC - Abnormal; Notable for the following components:   Ketones, ur 20 (*)    All other components within normal limits  CBC WITH DIFFERENTIAL/PLATELET  LIPASE, BLOOD  LACTIC ACID, PLASMA  LACTIC ACID, PLASMA    EKG None  Radiology CT ABDOMEN PELVIS W CONTRAST  Result Date: 01/23/2023 CLINICAL DATA:  Acute abdominal pain EXAM: CT ABDOMEN AND PELVIS WITH CONTRAST TECHNIQUE: Multidetector CT imaging of the abdomen and pelvis was performed using the standard protocol following bolus administration of  intravenous contrast. RADIATION DOSE REDUCTION: This exam was performed according to the departmental dose-optimization program which includes automated exposure control, adjustment of the mA and/or kV according to patient size and/or use of iterative reconstruction technique. CONTRAST:  OMNIPAQUE IOHEXOL 300 MG/ML  SOLN COMPARISON:  CT examination dated February 03, 2020 FINDINGS: Lower chest: No acute abnormality. Hepatobiliary: No focal liver abnormality is seen. No gallstones, gallbladder wall thickening, or biliary dilatation. Pancreas: Unremarkable. No pancreatic ductal dilatation or surrounding inflammatory changes. Spleen: Normal in size without focal abnormality. Adrenals/Urinary Tract: Adrenal glands are unremarkable. Kidneys are normal, without renal calculi, focal lesion, or hydronephrosis. Bladder is unremarkable. Stomach/Bowel: Stomach is within normal limits. Appendix not identified, however no inflammatory changes in the pericecal region. No evidence of bowel wall thickening, distention, or inflammatory changes. Vascular/Lymphatic: No significant vascular findings are present. No enlarged abdominal or pelvic lymph nodes. Reproductive: Prostate is unremarkable. Other: No abdominal wall hernia or abnormality. No abdominopelvic ascites. Musculoskeletal: No acute or significant osseous findings. IMPRESSION: 1. No CT evidence of acute abdominal/pelvic process. 2. Appendix not identified, however no inflammatory changes in the pericecal region. 3. No evidence of nephrolithiasis or hydronephrosis. Electronically Signed   By: Larose Hires D.O.   On: 01/23/2023 20:56    Procedures Procedures    Medications Ordered in ED Medications  oxyCODONE-acetaminophen (PERCOCET/ROXICET) 5-325 MG per tablet 1 tablet (has no administration in time range)  oxyCODONE-acetaminophen (PERCOCET/ROXICET) 5-325 MG per tablet 1 tablet (has no administration in time range)  ondansetron (ZOFRAN-ODT) disintegrating tablet  4 mg (has no administration in time range)  morphine (PF) 4 MG/ML injection 4 mg (4 mg Intravenous Given 01/23/23 1958)  sodium chloride 0.9 % bolus 1,000 mL (1,000 mLs Intravenous New Bag/Given 01/23/23 1958)  ondansetron (ZOFRAN) injection 4 mg (4 mg Intravenous Given 01/23/23 1958)  iohexol (OMNIPAQUE) 300 MG/ML solution 100 mL (100 mLs Intravenous Contrast Given 01/23/23 2034)    ED Course/ Medical Decision Making/ A&P                             Medical Decision Making Amount and/or Complexity of Data Reviewed Labs: ordered. Radiology: ordered.  Risk Prescription drug management.     This patient presents to the ED for concern of abdominal pain, this involves a number of treatment options, and is a complaint that carries with it a high risk of complications and morbidity.  The differential diagnosis includes SBO, viral illness versus cholecystitis.    Co morbidities: Discussed in HPI   Brief History:  See HPI.   EMR reviewed including pt PMHx, past surgical history and past visits to ER.   See HPI for more details   Lab Tests:  I ordered and independently interpreted labs.  The pertinent results include:    I personally reviewed all laboratory work and imaging. Metabolic panel without any acute abnormality specifically kidney function within normal limits and no significant electrolyte abnormalities. CBC without leukocytosis or significant anemia.  Imaging Studies:  CT Abdomen and pelvis showed:   Cardiac Monitoring:  N/A  Medicines ordered:  I ordered medication including zofran, morphine, bolus  for symptomatic control Reevaluation of the patient after these medicines showed that the patient stayed the same I have reviewed the patients home medicines and have made adjustments as needed  Reevaluation:  After the interventions noted above I re-evaluated patient and found that they have :improved   Social Determinants of Health:  The patient's social  determinants of health were a factor in the care of this patient  Problem List / ED Course:  Patient here with several days of intermittent abdominal pain generalized without any focal point of tenderness.  Prior history of appendectomy developed adhesions and later developed an SBO.  Does not feel that symptoms are consistent with her prior bowel obstruction.  Did have bowel movement yesterday without any blood in his stool.  He is overall in stable condition, nontoxic in appearance.  Afebrile, no hypoxia, no tachycardia or hypotension.  Given morphine, Zofran, bolus to help with symptomatic treatment. Labs on today's visit without any leukocytosis, hemoglobin is within normal limits.  CMP without any electrolyte abnormality, creatinine levels unremarkable.  Lactic acid is also negative and lipase levels normal.  He does have a prior family history of colon cancer. Patient was informed of CT findings today, he does report improvement in his symptoms after morphine, will be given 1 dose of Percocet prior to discharge.  I did discuss with him that this medication can likely cause constipation and further worsen his pain therefore we will need to add MiraLAX versus stool softeners to help with avoiding constipation.  He is hemodynamically stable, return precautions discussed at length.  Patient stable for discharge.  Dispostion:  After consideration of the diagnostic results and the patients response to treatment, I feel that the patent would benefit from follow up with GI.    Portions of this note were generated with Scientist, clinical (histocompatibility and immunogenetics). Dictation errors may occur despite best attempts at proofreading.   Final Clinical Impression(s) / ED Diagnoses Final diagnoses:  Generalized abdominal pain    Rx / DC Orders ED Discharge Orders          Ordered    ondansetron (ZOFRAN) 4 MG tablet  Every 6 hours        01/23/23 2108    oxyCODONE-acetaminophen (PERCOCET/ROXICET) 5-325 MG tablet  Every 6  hours PRN        01/23/23 2108              Claude Manges, PA-C 01/23/23 2130    Charlynne Pander, MD 01/23/23 [phone]

## 2023-01-23 NOTE — ED Notes (Signed)
Hold lab draw at this time per Triage RN. 

## 2023-01-25 ENCOUNTER — Telehealth: Payer: Self-pay

## 2023-01-25 ENCOUNTER — Encounter: Payer: Self-pay | Admitting: Gastroenterology

## 2023-01-25 NOTE — Telephone Encounter (Signed)
Patient called to schedule a appointment with our office. Informed him he had a appointmnet schedule with [facility] and we would need a referral from his PCP or another office referring him to our office. He states he was seen in the ED on 01/23/2023 and they said he needed to be seen by a ED. Informed him they referred him to [facility] and not [place]. He asked when our first available would be if he got a referral informed him the first of September. He states he will call other GI office then

## 2023-01-29 DIAGNOSIS — K2900 Acute gastritis without bleeding: Secondary | ICD-10-CM | POA: Diagnosis not present

## 2023-01-29 DIAGNOSIS — F419 Anxiety disorder, unspecified: Secondary | ICD-10-CM | POA: Diagnosis not present

## 2023-02-01 ENCOUNTER — Other Ambulatory Visit: Payer: BC Managed Care – PPO

## 2023-03-14 DIAGNOSIS — K4090 Unilateral inguinal hernia, without obstruction or gangrene, not specified as recurrent: Secondary | ICD-10-CM | POA: Diagnosis not present

## 2023-03-14 DIAGNOSIS — R1084 Generalized abdominal pain: Secondary | ICD-10-CM | POA: Diagnosis not present

## 2023-03-14 DIAGNOSIS — R197 Diarrhea, unspecified: Secondary | ICD-10-CM | POA: Diagnosis not present

## 2023-03-14 DIAGNOSIS — K625 Hemorrhage of anus and rectum: Secondary | ICD-10-CM | POA: Diagnosis not present

## 2023-03-19 ENCOUNTER — Telehealth: Payer: Self-pay | Admitting: Gastroenterology

## 2023-03-19 NOTE — Telephone Encounter (Signed)
Wanted to know if he could drink milk during prep.  Advised clear liquids only.  Asked if he could take carafate. Advised to hold on taking carafate.  Asked if his driver has to stay for duration of procedure tomorrow. Discussed to ask the front office staff when he arrives the protocol of [facility]

## 2023-03-20 DIAGNOSIS — R1013 Epigastric pain: Secondary | ICD-10-CM | POA: Diagnosis not present

## 2023-03-20 DIAGNOSIS — K625 Hemorrhage of anus and rectum: Secondary | ICD-10-CM | POA: Diagnosis not present

## 2023-03-20 DIAGNOSIS — R197 Diarrhea, unspecified: Secondary | ICD-10-CM | POA: Diagnosis not present

## 2023-03-20 DIAGNOSIS — K3189 Other diseases of stomach and duodenum: Secondary | ICD-10-CM | POA: Diagnosis not present

## 2023-03-20 DIAGNOSIS — K2970 Gastritis, unspecified, without bleeding: Secondary | ICD-10-CM | POA: Diagnosis not present

## 2023-03-20 DIAGNOSIS — K2100 Gastro-esophageal reflux disease with esophagitis, without bleeding: Secondary | ICD-10-CM | POA: Diagnosis not present

## 2023-03-20 DIAGNOSIS — K921 Melena: Secondary | ICD-10-CM | POA: Diagnosis not present

## 2023-03-20 DIAGNOSIS — K2950 Unspecified chronic gastritis without bleeding: Secondary | ICD-10-CM | POA: Diagnosis not present

## 2023-03-20 DIAGNOSIS — K562 Volvulus: Secondary | ICD-10-CM | POA: Diagnosis not present

## 2023-04-10 ENCOUNTER — Ambulatory Visit: Payer: BC Managed Care – PPO | Admitting: Gastroenterology

## 2023-04-18 DIAGNOSIS — K2100 Gastro-esophageal reflux disease with esophagitis, without bleeding: Secondary | ICD-10-CM | POA: Diagnosis not present

## 2023-06-26 DIAGNOSIS — K2270 Barrett's esophagus without dysplasia: Secondary | ICD-10-CM | POA: Diagnosis not present

## 2023-06-26 DIAGNOSIS — R12 Heartburn: Secondary | ICD-10-CM | POA: Diagnosis not present

## 2023-06-26 DIAGNOSIS — K2100 Gastro-esophageal reflux disease with esophagitis, without bleeding: Secondary | ICD-10-CM | POA: Diagnosis not present

## 2023-06-26 DIAGNOSIS — K209 Esophagitis, unspecified without bleeding: Secondary | ICD-10-CM | POA: Diagnosis not present

## 2023-07-31 ENCOUNTER — Emergency Department: Payer: BC Managed Care – PPO

## 2023-07-31 ENCOUNTER — Other Ambulatory Visit: Payer: Self-pay

## 2023-07-31 ENCOUNTER — Emergency Department
Admission: EM | Admit: 2023-07-31 | Discharge: 2023-07-31 | Disposition: A | Discharge status: Home / Self Care | Payer: BC Managed Care – PPO | Attending: Student in an Organized Health Care Education/Training Program | Admitting: Student in an Organized Health Care Education/Training Program

## 2023-07-31 ENCOUNTER — Emergency Department (HOSPITAL_COMMUNITY)
Admission: EM | Admit: 2023-07-31 | Discharge: 2023-07-31 | Discharge status: Against Medical Advice | Payer: BC Managed Care – PPO

## 2023-07-31 DIAGNOSIS — Z20822 Contact with and (suspected) exposure to covid-19: Secondary | ICD-10-CM | POA: Diagnosis not present

## 2023-07-31 DIAGNOSIS — R110 Nausea: Secondary | ICD-10-CM | POA: Diagnosis not present

## 2023-07-31 DIAGNOSIS — R519 Headache, unspecified: Secondary | ICD-10-CM | POA: Diagnosis not present

## 2023-07-31 DIAGNOSIS — R0981 Nasal congestion: Secondary | ICD-10-CM | POA: Insufficient documentation

## 2023-07-31 DIAGNOSIS — M542 Cervicalgia: Secondary | ICD-10-CM | POA: Diagnosis not present

## 2023-07-31 DIAGNOSIS — G4452 New daily persistent headache (NDPH): Secondary | ICD-10-CM | POA: Diagnosis not present

## 2023-07-31 LAB — CBC WITH DIFFERENTIAL/PLATELET
Abs Immature Granulocytes: 0.01 10*3/uL (ref 0.00–0.07)
Basophils Absolute: 0 10*3/uL (ref 0.0–0.1)
Basophils Relative: 1 %
Eosinophils Absolute: 0.1 10*3/uL (ref 0.0–0.5)
Eosinophils Relative: 1 %
HCT: 43.8 % (ref 39.0–52.0)
Hemoglobin: 15.1 g/dL (ref 13.0–17.0)
Immature Granulocytes: 0 %
Lymphocytes Relative: 19 %
Lymphs Abs: 0.9 10*3/uL (ref 0.7–4.0)
MCH: 32.1 pg (ref 26.0–34.0)
MCHC: 34.5 g/dL (ref 30.0–36.0)
MCV: 93.2 fL (ref 80.0–100.0)
Monocytes Absolute: 0.6 10*3/uL (ref 0.1–1.0)
Monocytes Relative: 12 %
Neutro Abs: 3.2 10*3/uL (ref 1.7–7.7)
Neutrophils Relative %: 67 %
Platelets: 178 10*3/uL (ref 150–400)
RBC: 4.7 MIL/uL (ref 4.22–5.81)
RDW: 12.9 % (ref 11.5–15.5)
WBC: 4.7 10*3/uL (ref 4.0–10.5)
nRBC: 0 % (ref 0.0–0.2)

## 2023-07-31 LAB — RESP PANEL BY RT-PCR (RSV, FLU A&B, COVID)  RVPGX2
Influenza A by PCR: NEGATIVE
Influenza B by PCR: NEGATIVE
Resp Syncytial Virus by PCR: NEGATIVE
SARS Coronavirus 2 by RT PCR: NEGATIVE

## 2023-07-31 LAB — BASIC METABOLIC PANEL
Anion gap: 12 (ref 5–15)
BUN: 13 mg/dL (ref 6–20)
CO2: 26 mmol/L (ref 22–32)
Calcium: 9.2 mg/dL (ref 8.9–10.3)
Chloride: 99 mmol/L (ref 98–111)
Creatinine, Ser: 0.98 mg/dL (ref 0.61–1.24)
GFR, Estimated: >60 mL/min (ref >60)
Glucose, Bld: 95 mg/dL (ref 70–99)
Potassium: 4.2 mmol/L (ref 3.5–5.1)
Sodium: 137 mmol/L (ref 135–145)

## 2023-07-31 LAB — MONONUCLEOSIS SCREEN: Mono Screen: NEGATIVE

## 2023-07-31 LAB — GROUP A STREP BY PCR: Group A Strep by PCR: NOT DETECTED

## 2023-07-31 NOTE — ED Notes (Signed)
 Pt is now at different hospital.

## 2023-07-31 NOTE — ED Provider Notes (Signed)
 [facility] Provider Note    Event Date/Time   First MD Initiated Contact with Patient 07/31/23 1702     (approximate)   History   Neck Pain   HPI  Robert Franco is a 38 y.o. male with no significant past medical history presents the ER for evaluation of 2 to 3 days of headache and some neckache.  States last night he was having some chills but no measured temperature.  Denies any cough.  Has had some mild congestion and ear fullness.  Denies any abdominal pain no nausea or vomiting no blurry vision no numbness or tingling.  Went to urgent care today had viral panel checked and was negative and was told to come to the ER to rule out meningitis.     Physical Exam   Triage Vital Signs: ED Triage Vitals  Encounter Vitals Group     BP 07/31/23 1317 133/86     Systolic BP Percentile --      Diastolic BP Percentile --      Pulse Rate 07/31/23 1317 77     Resp 07/31/23 1317 17     Temp 07/31/23 1319 98.1 F (36.7 C)     Temp Source 07/31/23 1317 Oral     SpO2 07/31/23 1317 100 %     Weight 07/31/23 1319 160 lb 0.9 oz (72.6 kg)     Height 07/31/23 1319 5' 10 (1.778 m)     Head Circumference --      Peak Flow --      Pain Score 07/31/23 1319 7     Pain Loc --      Pain Education --      Exclude from Growth Chart --     Most recent vital signs: Vitals:   07/31/23 1317 07/31/23 1319  BP: 133/86   Pulse: 77   Resp: 17   Temp:  98.1 F (36.7 C)  SpO2: 100%      Constitutional: Alert  Eyes: Conjunctivae are normal.  Head: Atraumatic. Nose: No congestion/rhinnorhea. Mouth/Throat: Mucous membranes are moist.   Neck: Painless ROM.  Cardiovascular:   Good peripheral circulation. Respiratory: Normal respiratory effort.  No retractions.  Gastrointestinal: Soft and nontender.  Musculoskeletal:  no deformity Neurologic:  MAE spontaneously. No gross focal neurologic deficits are appreciated.  Skin:  Skin is warm, dry and intact. No rash  noted. Psychiatric: Mood and affect are normal. Speech and behavior are normal.    ED Results / Procedures / Treatments   Labs (all labs ordered are listed, but only abnormal results are displayed) Labs Reviewed  RESP PANEL BY RT-PCR (RSV, FLU A&B, COVID)  [id number]  GROUP A STREP BY PCR  BASIC METABOLIC PANEL  CBC WITH DIFFERENTIAL/PLATELET  MONONUCLEOSIS SCREEN     EKG     RADIOLOGY Please see ED Course for my review and interpretation.  I personally reviewed all radiographic images ordered to evaluate for the above acute complaints and reviewed radiology reports and findings.  These findings were personally discussed with the patient.  Please see medical record for radiology report.    PROCEDURES:  Critical Care performed:   Procedures   MEDICATIONS ORDERED IN ED: Medications - No data to display   IMPRESSION / MDM / ASSESSMENT AND PLAN / ED COURSE  I reviewed the triage vital signs and the nursing notes.  Differential diagnosis includes, but is not limited to, migraine, tension headache, SAH, IPH, mass, URI  Patient presenting to the ER for evaluation of symptoms as described above.  Based on symptoms, risk factors and considered above differential, this presenting complaint could reflect a potentially life-threatening illness therefore the patient will be placed on continuous pulse oximetry and telemetry for monitoring.  Laboratory evaluation will be sent to evaluate for the above complaints.  Patient is exceedingly well-appearing nontoxic he is afebrile hemodynamically stable.  His blood work is quite reassuring.  He does not have any white count.  CT imaging ordered triage is reassuring.  This is not consistent with SAH or aneurysm is no thunderclap headache.  He has painless full range of motion.  No adenopathy.  This is not consistent with meningitis.  We discussed well that is on the differential for persons with headache and some  neck pain his exam is reassuring we discussed that the weight of formally diagnosed and rule out meningitis includes lumbar puncture.  Discussed the risks associated with lumbar puncture and patient understands my concern that the risks associated with this procedure exceed the likelihood of meningitis and his clinical presentation.  He is reliable and agreeable to return if his symptoms worsen.  He is declining anything for his headache.       FINAL CLINICAL IMPRESSION(S) / ED DIAGNOSES   Final diagnoses:  Nonintractable headache, unspecified chronicity pattern, unspecified headache type     Rx / DC Orders   ED Discharge Orders     None        Note:  This document was prepared using Dragon voice recognition software and may include unintentional dictation errors.    Lang Dover, MD 07/31/23 1719

## 2023-07-31 NOTE — Discharge Instructions (Signed)

## 2023-07-31 NOTE — ED Triage Notes (Signed)
 Pt here with neck pain and a headache since Sat. Pt was sent here by UC to rule out meningitis. Pt endorses a fever last night. Pt ambulatory to triage.

## 2023-07-31 NOTE — ED Provider Triage Note (Signed)
 Emergency Medicine Provider Triage Evaluation Note  Robert Franco , a 38 y.o. male  was evaluated in triage.  Pt complains of neck pain and headache x 3 days, sent by urgent care to r/o meningitis.  Review of Systems  Positive:  Negative:   Physical Exam  BP 133/86   Pulse 77   Temp 98.1 F (36.7 C) (Oral)   Resp 17   Ht 5' 10 (1.778 m)   Wt 72.6 kg   SpO2 100%   BMI 22.97 kg/m  Gen:   Awake, no distress   Resp:  Normal effort  MSK:   Moves extremities without difficulty  Other:    Medical Decision Making  Medically screening exam initiated at 1:23 PM.  Appropriate orders placed.  Robert Franco was informed that the remainder of the evaluation will be completed by another provider, this initial triage assessment does not replace that evaluation, and the importance of remaining in the ED until their evaluation is complete.     Gasper Devere ORN, PA-C 07/31/23 1323

## 2023-10-21 DIAGNOSIS — M25562 Pain in left knee: Secondary | ICD-10-CM | POA: Diagnosis not present

## 2023-10-26 ENCOUNTER — Ambulatory Visit: Admitting: Orthopedic Surgery

## 2023-11-15 DIAGNOSIS — H6122 Impacted cerumen, left ear: Secondary | ICD-10-CM | POA: Diagnosis not present

## 2023-11-15 DIAGNOSIS — Z0000 Encounter for general adult medical examination without abnormal findings: Secondary | ICD-10-CM | POA: Diagnosis not present

## 2023-11-15 DIAGNOSIS — F419 Anxiety disorder, unspecified: Secondary | ICD-10-CM | POA: Diagnosis not present

## 2024-01-01 DIAGNOSIS — G5601 Carpal tunnel syndrome, right upper limb: Secondary | ICD-10-CM | POA: Diagnosis not present

## 2024-02-29 DIAGNOSIS — M7702 Medial epicondylitis, left elbow: Secondary | ICD-10-CM | POA: Diagnosis not present

## 2024-02-29 DIAGNOSIS — S60222A Contusion of left hand, initial encounter: Secondary | ICD-10-CM | POA: Diagnosis not present

## 2024-06-06 DIAGNOSIS — M25572 Pain in left ankle and joints of left foot: Secondary | ICD-10-CM | POA: Diagnosis not present

## 2024-06-06 DIAGNOSIS — M2142 Flat foot [pes planus] (acquired), left foot: Secondary | ICD-10-CM | POA: Diagnosis not present

## 2024-06-07 DIAGNOSIS — H66002 Acute suppurative otitis media without spontaneous rupture of ear drum, left ear: Secondary | ICD-10-CM | POA: Diagnosis not present

## 2024-06-07 DIAGNOSIS — J029 Acute pharyngitis, unspecified: Secondary | ICD-10-CM | POA: Diagnosis not present

## 2024-06-09 DIAGNOSIS — M25572 Pain in left ankle and joints of left foot: Secondary | ICD-10-CM | POA: Diagnosis not present

## 2024-06-09 DIAGNOSIS — M2142 Flat foot [pes planus] (acquired), left foot: Secondary | ICD-10-CM | POA: Diagnosis not present

## 2024-06-26 DIAGNOSIS — M2142 Flat foot [pes planus] (acquired), left foot: Secondary | ICD-10-CM | POA: Diagnosis not present

## 2024-06-26 DIAGNOSIS — M25572 Pain in left ankle and joints of left foot: Secondary | ICD-10-CM | POA: Diagnosis not present

## 2024-06-27 DIAGNOSIS — J029 Acute pharyngitis, unspecified: Secondary | ICD-10-CM | POA: Diagnosis not present

## 2024-06-27 DIAGNOSIS — R0981 Nasal congestion: Secondary | ICD-10-CM | POA: Diagnosis not present

## 2024-06-27 DIAGNOSIS — R051 Acute cough: Secondary | ICD-10-CM | POA: Diagnosis not present
